# Patient Record
Sex: Female | Born: 1955 | Race: White | Hispanic: No | Smoking: Never smoker
Health system: Southern US, Community
[De-identification: ages and names within clinical notes are randomized; demographics above are authoritative.]

## PROBLEM LIST (undated history)

## (undated) DIAGNOSIS — F329 Major depressive disorder, single episode, unspecified: Secondary | ICD-10-CM

## (undated) DIAGNOSIS — F32A Depression, unspecified: Secondary | ICD-10-CM

## (undated) DIAGNOSIS — I10 Essential (primary) hypertension: Secondary | ICD-10-CM

## (undated) HISTORY — PX: ABDOMINAL HYSTERECTOMY: SHX81

## (undated) HISTORY — PX: CERVICAL SPINE SURGERY: SHX589

---

## 1998-08-31 ENCOUNTER — Other Ambulatory Visit: Admission: RE | Admit: 1998-08-31 | Discharge: 1998-08-31 | Payer: Self-pay | Admitting: *Deleted

## 1999-10-19 ENCOUNTER — Other Ambulatory Visit: Admission: RE | Admit: 1999-10-19 | Discharge: 1999-10-19 | Payer: Self-pay | Admitting: *Deleted

## 2000-11-28 ENCOUNTER — Other Ambulatory Visit: Admission: RE | Admit: 2000-11-28 | Discharge: 2000-11-28 | Payer: Self-pay | Admitting: *Deleted

## 2001-04-15 ENCOUNTER — Encounter (INDEPENDENT_AMBULATORY_CARE_PROVIDER_SITE_OTHER): Payer: Self-pay

## 2001-04-15 ENCOUNTER — Ambulatory Visit (HOSPITAL_COMMUNITY): Admission: RE | Admit: 2001-04-15 | Discharge: 2001-04-15 | Payer: Self-pay | Admitting: *Deleted

## 2001-12-16 ENCOUNTER — Other Ambulatory Visit: Admission: RE | Admit: 2001-12-16 | Discharge: 2001-12-16 | Payer: Self-pay | Admitting: *Deleted

## 2002-03-27 ENCOUNTER — Ambulatory Visit (HOSPITAL_COMMUNITY): Admission: RE | Admit: 2002-03-27 | Discharge: 2002-03-27 | Payer: Self-pay | Admitting: *Deleted

## 2002-12-22 ENCOUNTER — Other Ambulatory Visit: Admission: RE | Admit: 2002-12-22 | Discharge: 2002-12-22 | Payer: Self-pay | Admitting: *Deleted

## 2003-04-01 ENCOUNTER — Ambulatory Visit (HOSPITAL_COMMUNITY): Admission: RE | Admit: 2003-04-01 | Discharge: 2003-04-01 | Payer: Self-pay | Admitting: *Deleted

## 2003-07-02 ENCOUNTER — Emergency Department (HOSPITAL_COMMUNITY): Admission: EM | Admit: 2003-07-02 | Discharge: 2003-07-02 | Payer: Self-pay | Admitting: Emergency Medicine

## 2003-12-28 ENCOUNTER — Other Ambulatory Visit: Admission: RE | Admit: 2003-12-28 | Discharge: 2003-12-28 | Payer: Self-pay | Admitting: *Deleted

## 2004-02-01 ENCOUNTER — Other Ambulatory Visit: Admission: RE | Admit: 2004-02-01 | Discharge: 2004-02-01 | Payer: Self-pay | Admitting: *Deleted

## 2004-03-24 ENCOUNTER — Encounter (INDEPENDENT_AMBULATORY_CARE_PROVIDER_SITE_OTHER): Payer: Self-pay | Admitting: Specialist

## 2004-03-24 ENCOUNTER — Inpatient Hospital Stay (HOSPITAL_COMMUNITY): Admission: EM | Admit: 2004-03-24 | Discharge: 2004-03-27 | Payer: Self-pay | Admitting: *Deleted

## 2004-04-20 ENCOUNTER — Ambulatory Visit (HOSPITAL_COMMUNITY): Admission: RE | Admit: 2004-04-20 | Discharge: 2004-04-20 | Payer: Self-pay | Admitting: *Deleted

## 2004-04-26 ENCOUNTER — Encounter: Admission: RE | Admit: 2004-04-26 | Discharge: 2004-04-26 | Payer: Self-pay | Admitting: *Deleted

## 2005-04-25 ENCOUNTER — Ambulatory Visit (HOSPITAL_COMMUNITY): Admission: RE | Admit: 2005-04-25 | Discharge: 2005-04-25 | Payer: Self-pay | Admitting: *Deleted

## 2006-05-01 ENCOUNTER — Ambulatory Visit (HOSPITAL_COMMUNITY): Admission: RE | Admit: 2006-05-01 | Discharge: 2006-05-01 | Payer: Self-pay | Admitting: *Deleted

## 2007-06-10 ENCOUNTER — Ambulatory Visit (HOSPITAL_COMMUNITY): Admission: RE | Admit: 2007-06-10 | Discharge: 2007-06-10 | Payer: Self-pay | Admitting: *Deleted

## 2007-06-13 ENCOUNTER — Encounter: Admission: RE | Admit: 2007-06-13 | Discharge: 2007-06-13 | Payer: Self-pay | Admitting: Obstetrics and Gynecology

## 2008-05-26 ENCOUNTER — Ambulatory Visit (HOSPITAL_COMMUNITY): Admission: RE | Admit: 2008-05-26 | Discharge: 2008-05-26 | Payer: Self-pay | Admitting: Obstetrics and Gynecology

## 2008-12-13 ENCOUNTER — Emergency Department (HOSPITAL_COMMUNITY): Admission: EM | Admit: 2008-12-13 | Discharge: 2008-12-14 | Payer: Self-pay | Admitting: Emergency Medicine

## 2011-05-04 NOTE — Discharge Summary (Signed)
NAMEVENISHA, Meredith Mccormick                      ACCOUNT NO.:  [id number]   MEDICAL RECORD NO.:  [id number]                   PATIENT TYPE:  INP   LOCATION:  0473                                 FACILITY:  [facility]   PHYSICIAN:  Pershing Cox, M.D.            DATE OF BIRTH:  1956-02-06   DATE OF ADMISSION:  03/24/2004  DATE OF DISCHARGE:  03/27/2004                                 DISCHARGE SUMMARY   ADMISSION DIAGNOSIS:  Abnormal glandular cells on Pap smear with  unsatisfactory colposcopy.   HISTORY OF PRESENT ILLNESS:  For details of the patient's admission history  and physical, please see the transcribed note dated on March 24, 2004.  Briefly, this 55 year old female had a recent Pap smear showing atypical  glandular cells.  She had a previous D&C hysteroscopy and tissue from this  procedure two years ago was reviewed and found no evidence of the  abnormality there.  Dr. Berneta Levins was concerned enough about this Pap  smear that she recommended excision.  Colposcopic examination showed no  lesion, and for that reason the patient was brought to the operating room  for a cold knife cone biopsy.   HOSPITAL COURSE:  On the day of admission, the patient was taken to the  operating room where a cold knife cone biopsy was performed.  Because of  unrelenting hemorrhage, a decision was made to explore her to do a total  abdominal hysterectomy.  This procedure showed a defect lateral to the right  uterosacral ligament which was the source of the bleeding.  By moving the  ureter aside we were able to safely do a total abdominal hysterectomy.  The  majority of the blood loss was lost during the cone biopsy, but the  estimated blood loss was 1400 cc.  Postoperatively, the patient's hemoglobin  dropped to 8.4.  It stabilized and she had a very rapid postoperative  recovery.  She was able to be discharged on the morning of postoperative day  #3.  At that time, her hemoglobin was 7.8, but she  was taking a regular diet  and able to begin iron therapy which she will be doing b.i.d.  Pathology  showed cervical dysplasia extending into glands.  This was not available at  the time of her discharge, but is present on the chart at the time of this  dictation.  There was focal adenomyosis, otherwise, the pathology was  benign.  The patient will be seen in my office for staple removal in four  days.   DISCHARGE DIAGNOSES:  1. Moderate dysplasia of the cervix.  2. Intraoperative hemorrhage.  3. Anemia.                                               Pershing Cox, M.D.  MAJ/MEDQ  D:  04/03/2004  T:  04/03/2004  Job:  [id number]

## 2011-05-04 NOTE — Op Note (Signed)
[facility]  Patient:    Meredith Mccormick, Meredith Mccormick                   MRN: [id number] Proc. Date: 04/15/01 Adm. Date:  [id number] Attending:  Marin Comment                           Operative Report  PREOPERATIVE DIAGNOSIS:  Menorrhagia, abnormal endometrium on sonogram.  POSTOPERATIVE DIAGNOSIS:  Menorrhagia, normal endometrial cavity, scant endometrial curettings.  PROCEDURE:  Exam under anesthesia, hysteroscopy, dilation and curettage.  SURGEON:  Pershing Cox, M.D.  ANESTHESIA:  MAC plus Marcaine paracervical block.  INDICATIONS FOR PROCEDURE:  Meredith Mccormick is a 55 year old female, who has been followed in my office for routine gynecologic care for several years. Over the last year, she has experienced heavier bleeding with pad changes every two hours to every 30 minutes on a heavy day.  She also passes large clots.  At the time of her last visit, she complained of increase in her bleeding.  She was brought to the office for a sonogram which showed a 1.4 cm endometrium, irregular in contour.  Hydrosonogram was attempted, but the patient did not tolerate the injection of fluid.  With the little visualization we got, we thought there might be a polyp on the posterior and anterior wall.  After discussing the procedure with the patient, she was brought to the operating room today for diagnostic testing.  FINDINGS:  The uterus is approximately 8 weeks in size.  There are no adnexal masses.  The cavity sounds to 7 cm.  There were no endometrial filling defects.  The ostia were occluded, but the fundus of the uterus was adequately visualized.  DESCRIPTION OF PROCEDURE:  [name] was brought to the operating room with an IV in place.  She received 1 gram of Ancef in the holding area. Supine on the OR table, IV sedation was administered.  She was then placed into Allen stirrups.  Hibiclens was used to prep the perineum and vagina.   She was draped sterilely for a vaginal procedure with a collecting drape for effluent management.  Bivalve speculum was inserted into the vagina.  The cervix was visualized and grasped with a single-tooth tenaculum on the anterior cervix after 0.25% Marcaine was injected into the anterior cervix. Marcaine paracervical block was administered at 3, 4, 7, and 8 positions using a total volume of 18 cc of this solution.  Kevorkian curette was used to obtain endocervical curettings.  The uterine sound then passed to a depth of 7 cm.  The cervix was dilated with 0 Pratt dilators to a size 35.  Resectoscope was introduced into the cervix, and through-and-through irrigation was used to help visualize the upper endometrium.  Pictures were taken to document what we could see.  There was a modest amount of shaggy endometrium.  After the visualization showed no evidence of polyps or submucosal myomas, a decision was made to perform D&C and then look again.  The resectoscope was removed. Small sharp curette was used to serially dilate four quadrants and then selectively in all 12 dimensions of the cervix.  Following this, the uterus felt scrapie in texture.  Neigs curette was used to curette the lining.  This was definitely scrapie in texture throughout the endometrial lining.  No polyps were obtained.  No abnormal endometrium was obtained.  The resectoscope was reintroduced.  The cavity was smooth.  Further  pictures were taken to document this.  The resectoscope was withdrawn.  The patient was cleansed with water to wash the Hibiclens off of her perineum.  She was placed again in the supine position and taken to the recovery room for recovery. DD:  04/15/01 TD:  04/15/01 Job: [id number]

## 2011-05-04 NOTE — Op Note (Signed)
NAMEBERKLEIGH, [name]                      ACCOUNT NO.:  [id number]   MEDICAL RECORD NO.:  [id number]                   PATIENT TYPE:  INP   LOCATION:  0473                                 FACILITY:  [facility]   PHYSICIAN:  Pershing Cox, M.D.            DATE OF BIRTH:  Sep 14, 1956   DATE OF PROCEDURE:  03/24/2004  DATE OF DISCHARGE:                                 OPERATIVE REPORT   PREOPERATIVE DIAGNOSES:  Abnormal glandular cells on Pap smear.   POSTOPERATIVE DIAGNOSES:  Posterior cervical perforation during cone biopsy,  atypical glandular cells on Pap smear.   PROCEDURE:  Cold knife conization of the cervix, exploratory laparotomy,  total abdominal hysterectomy with right ureteral exploration.   SURGEON:  Pershing Cox, M.D.   ASSISTANT:  Maxie Better, M.D. and Genia Del, M.D.   ANESTHESIA:  General endotracheal.   INDICATIONS FOR PROCEDURE:  [name] is a 55 year old female who was well  known to my office.  She has been followed in my office for some time.  In  2002, she had a D&C hysteroscopy for menorrhagia and an abnormal sonogram.  At that time, there were no endometrial filling defects found and the  pathology specimen showed a benign endocervical mucosa and benign  proliferative endometrium with hyperplasia.  The patient had a Pap smear in  my office on February 01, 2004 which showed glandular cell abnormality with  a atypical glandular cells seen. These were worrisome for glandular  involvement with dysplasia or neoplasia and this Pap as well as the  patient's previous biopsy were reviewed by Dr. Clelia Croft. She was concerned that  this might represent adenocarcinoma of the cervix and therefore [name]  was brought to the operating room and the cervix was examined by colposcopy.  There were no abnormal findings. Endometrial biopsy was obtained and this  also showed benign tissue, degenerative secretory type. After counseling,  she agreed to a  cone biopsy. Our plan at that time was if the cone biopsy  was negative we would proceed to hysterectomy.  Today during her cone  biopsy, there was uncontrolled bleeding. On exploration, she had a posterior  cervical perforation just to the lateral margin of the right uterosacral  ligament.   DESCRIPTION OF PROCEDURE:  [name] was brought to the operating  room with an IV in place. PAS stockings were placed on her lower extremities  and she received a gram of Ancef in the holding area.  Supine on the OR  table, IV sedation was administered and the patient was then administered  general anesthesia by an LMA mask. Later in the operation when hysterectomy  was necessary, the LMA was converted to an endotracheal tube.  Supine on the  OR table, the perineum and vagina were prepped with a solution of Hibiclens  and sterile linens were applied. She was then draped for a sterile vaginal  procedure. A bivalve speculum was inserted into the  vagina. The cervix was  visualized and 0.25% Marcaine was injected into the anterior cervix.  Angled  sutures were then placed on the lateral margins of the cervix placing a  superficial and then deeper __________ to ligate the descending cervical  branches of the uterine artery.  Vasopressin was then injected into the  cervix. This was a dilute solution of vasopressin with 10 units and 60 mL.  I used a total volume of 20 mL of this solution.  The patient's blood  pressure was stable during the injection.  The uterine sound then passed to  a depth of the cone which was about 4 cm.  Using an 11 blade, a 2 1/2 cm  exocervical cone was cut.  By using retraction, the cone was brought deeper  into the substance of the cervix.  At this point, an Allis clamp was placed  to oppose the anterior and posterior portions of the cone.  The cone was  then further incised with the blade.  Once the cone had come to a 1 cm tip,  I used Mayo scissors to excise the cone.   There was a moderate amount of  bleeding during this procedure and therefore I placed a second angled suture  on each side, this did not control the bleeding. I then placed Sturmdorf  sutures anterior and posterior and the bleeding was still not controlled.  At this point, I put some packing into the upper cervix hoping to be able to  control the bleeding but the patient bled through the packing.  I placed a  digit into the cervical canal and felt a defect onto the patient's right.  At this point, I made a decision to proceed with a hysterectomy.  I was able  to control the patient's bleeding from the uterus by applying firm pressure  to the uterus and lower uterine segment through the anterior abdominal wall  essentially compressing the uterus against the symphysis. There was no blood  loss from this point on until we began the hysterectomy.   The anterior abdominal wall was shaved just over the symphysis.  Betadine  was used to prep the anterior abdominal wall and with my hand in the vagina  the patient was draped for an abdominal hysterectomy.  A Foley catheter was  inserted into the urethra again while I was holding pressure on the uterus.  At this point, the surgical tech took my place and continued pressure along  the anterior abdominal wall.  Prior to leaving the vagina, I cut the sutures  which had been tagged holding the lateral margins of the cervix.  These were  left long enough for [name] to identify intraabdominally.   An incision was made in the skin approximately three finger breadths above  the symphysis pubis, carried down to the subcutaneous tissues until the  fascia was exposed. The fascia was opened initially with cautery and then  with scissors extending the incision laterally. I would like to mention here  that the fascia was very thin consistent with fascia you would notice on a  more elderly patient.  The fascia was separated from the rectus muscles and the rectus  muscles were separated in the midline exposing the peritoneum.  The peritoneum was tented and opened atraumatically. The peritoneal incision  was extended. Once in the peritoneal cavity, it was clear that there was  bleeding in the peritoneal cavity consistent with damage to one of the major  uterine vessels. For this reason,  I put my hand into the pelvis and grasped  the uterus and pulled it tight under tension again placing pressure on it at  this point.  The Balfour retractor was then placed into the anterior  abdominal wall,  retracting the side wall as well I put pressure on the  uterus.  The bowel was gathered up into the upper abdomen with moist laps.  Long Kelly clamps were placed along the sides of the uterus. The bladder  blade was used to retract the bladder. The round ligaments were suture  ligated and transected bilaterally.  They were held with a hemostat for  later in the case. The broad ligament was perforated beneath the fallopian  tube and the fallopian tube and ovary were separated from the uterus.  This  pedicle was then clamped and suture ligated. Later in the case, this would  be revised once I decided to leave the ovaries in place. Later in the case,  the pedicle was trimmed, clamped, suture and free tie ligated and then fixed  to the round ligaments.  Both ovaries and fallopian tubes had simple cysts  on them and there was some bleeding from a corpus luteum on the patient's  left but this was controlled during the procedure.  It required no suturing  and no dissection.   The bladder was separated from the lower uterine segment and pushed down off  of the upper cervix.  The uterine arteries were skeletonized first on the  left and then on the right.  These uterine artery pedicles were cut and  suture ligated with #0 Vicryl suture. On the patient's right, I was able to  ligate the uterine artery in a manner just identical to the left but I could  not go down the  cardinal ligament because there was a hole that was  perforating from the side of the cervix lateral to the uterosacral ligament.   At this point, the bleeding was well controlled and therefore I exposed the  ureter on the right and dissected it all of its length down to the area  where it turned to enter the bladder. We had this ureter exposed throughout  all of the sutures that we placed on the right side in order to excise the  cervix.  Straight clamps were placed along the patient's left cardinal  ligament and these were suture ligated using Heaney technique.  The  uterosacral ligament was also clamped and suture ligated.  We were able to  enter the vagina on the patient's left. Then by using traction on the  residual cervix with the previous stitches as our mark, we were able to  excise the residual cervix from the upper vagina holding the vagina with  clamps to limit blood loss.  Once we had approached the right angle, the cervix was still attached to the site of the right uterosacral ligament.  The ureter was moved laterally and a curved clamp was placed across the  uterosacral ligament and the vagina. This was suture ligated using a Heaney  technique and the specimen was completely excised.  Angled sutures were  placed on both sides of the vagina.  The vagina was run with a locking #0  Vicryl suture. The vaginal mucosa was closed side to side using interrupted  figure-of-eight sutures.  The bladder was protected throughout this  dissection.   At this point, both uteroovarian pedicles were freshened and secured to the  round ligament to lift them out of  the pelvis. The patient's cul-de-sac was  deep but because of her significant blood loss up to this point, I did not  want to do a cul-de-sac plication.   Because of the cervical perforation, the bowel was run from the ileocecal  valve to the ligament of Treitz. There was no evidence of bowel injury.  The  cecum was inspected and  was totally healthy as were the ascending and  descending colon. The transverse colon was only palpated high in the abdomen  and it was full of stool.   The peritoneal cavity was irrigated with warm saline and the laps were  removed.  The small bowel was again run from its length and then layered  into the pelvis.  The rectus muscles were inspected and were appropriate,  cautery was used for hemostasis. The fascial edges were brought together  with running #0 Monocryl suture starting from the lateral edges and tying  two individual sutures in the middle.  The subcutaneous tissues were  irrigated and then closed with skin staples.   Estimated blood loss 1400 mL.  Urine output 400 mL.  Fluids 4000 mL.   COMPLICATIONS:  Posterior cervical perforation during a scheduled cone  biopsy.                                               Pershing Cox, M.D.    MAJ/MEDQ  D:  03/24/2004  T:  03/24/2004  Job:  [id number]   cc:   Genia Del, M.D.  [address]  [place]  [place] [address]  Fax: [phone]   Maxie Better, M.D.  [address]  [place]  [place] [address]  Fax: [phone]

## 2011-05-04 NOTE — H&P (Signed)
Meredith, Mccormick                      ACCOUNT NO.:  [id number]   MEDICAL RECORD NO.:  [id number]                   PATIENT TYPE:  INP   LOCATION:  0473                                 [facility]ACILITY:  [facility]   PHYSICIAN:  Pershing Cox, M.D.            DATE OF BIRTH:  [birth year]   DATE OF ADMISSION:  03/24/2004  DATE OF DISCHARGE:                                HISTORY & PHYSICAL   ADMISSION DIAGNOSES:  Total abdominal hysterectomy after posterior cervical  perforation from cervical cone biopsy.   HISTORY OF PRESENT ILLNESS:  Meredith Mccormick is 55 years old.  She is a  gravida 2, para 2 female.  She has been followed some time for heavy vaginal  bleeding.  In 2002, she underwent hysteroscopy D&C for heavy vaginal  bleeding and an abnormal sonogram.  That procedure showed no evidence of a  polyp and the pathology obtained at this time was consistent with both  benign intracervical tissue and benign endometrium.  At the time of her  annual examination in January of this year, a Pap smear was performed.  The  patient had complained at that time of heavy vaginal bleeding lasting 10  days with her last 2 cycles.  Her Pap smear returned consistent with  atypical glandular cells.  The previous pathology was reviewed by Dr. Berneta Levins and she was concerned that this patient might have an adenocarcinoma of  the cervix.  I performed a colposcopic examination on her and saw nothing  suspicious.  Endometrial biopsy was performed at this time and there was  only normal endometrium of secretory type found on this testing.  The  patient was counseled regarding the abnormal Pap smear and the decision was  made to pursue it with a cervical conization.  Our plan at the time of her  cone biopsy was that if nothing was found on her cone biopsy, it would be  necessary to perform a hysterectomy.   The patient was taken to the operating room this afternoon.  Cone biopsy was  performed using a  cold knife technique.  Hemorrhage after the cone biopsy  resulted in multiple attempts to control the bleeding.  I was not able to  control the bleeding even by packing and the decision was made to move to  hysterectomy.  At the time of hysterectomy, there was found to be a  perforation just lateral to the right uterosacral ligament involving a  uterine vein or artery which was probably the site of our abnormal bleeding  which could not be controlled.  The patient is admitted for postop care  after this event.   PAST MEDICAL HISTORY:   ALLERGIES:  SULFA and PENICILLIN.   MEDICATIONS:  1. Lexapro 20 mg p.o. daily.  2. Trazodone to sleep.   SERIOUS MEDICAL ILLNESSES:  1. History of episode of toxic shock syndrome.  2. Questionable history of pelvic inflammatory disease and depression.  PAST SURGICAL HISTORY:  1. Hysteroscopy D&C April of 2002.  2. History of rhinoplasty.  3. History of breast biopsy in 1998.   SOCIAL HISTORY:  The patient is married.  She has worked in the past as  Probation officer.   Her mother died of a GI bleed.  She had chronic lymphocytic leukemia and a  history of breast cancer x2 and a history of glaucoma.  The patient's father  died of lung cancer.  She has one sister who is alive and well and a family  history of osteoporosis.   [employer] review of systems, which is a 13-system review, showed all negative  systems at the time of her last exam.   PHYSICAL EXAMINATION:  GENERAL:  A well-developed, well-nourished Caucasian  female in no acute distress.  VITAL SIGNS:  BP 120/84, pulse 66, weight 164, height 5 feet 6-1/4 inches.  BMI 27.  HEENT:  Normocephalic, anicteric. EOMI, PERRLA.  NECK:  Thyroid is normal to palpation.  No bruits.  LUNGS:  Clear to auscultation and percussion.  CARDIOVASCULAR:  Regular rate and rhythm without murmur.  ABDOMEN AND BACK:  Show 4+ seborrheic keratoses.  There is no evidence of  hernia, no guarding, no rebound.  No CVA or spine tenderness.  LYMPHATICS:  No supraclavicular, axillary, or inguinal adenopathy.  SKIN:  No noted skin lesions.  NEUROPSYCHIATRIC:  Oriented to person, place, and time.  Mood and affect are  appropriate.  BREASTS:  Shows no palpable abnormalities.  PELVIC:  Normal external genitalia.  There is a first-degree rectocele.  The  vagina has normal appearance.  There is a wide ectropion of the cervix.  The  uterus is small, anteflexed, mobile, and nontender.  No adnexal masses.  GENITOURINARY:  The urethra and urethral meatus are well supported.  Bladder  is well supported.  RECTAL:  Anus and perineum intact.   ASSESSMENT:  History of atypical glandular cells on most recent Pap smear  first with cold knife conization with perforation and emergency total  abdominal hysterectomy.                                               Pershing Cox, M.D.    MAJ/MEDQ  D:  03/24/2004  T:  03/25/2004  Job:  [id number]

## 2011-07-01 ENCOUNTER — Emergency Department (INDEPENDENT_AMBULATORY_CARE_PROVIDER_SITE_OTHER): Payer: 59

## 2011-07-01 ENCOUNTER — Emergency Department (HOSPITAL_BASED_OUTPATIENT_CLINIC_OR_DEPARTMENT_OTHER)
Admission: EM | Admit: 2011-07-01 | Discharge: 2011-07-01 | Disposition: A | Discharge status: Home / Self Care | Payer: 59 | Attending: Emergency Medicine | Admitting: Emergency Medicine

## 2011-07-01 ENCOUNTER — Encounter: Payer: Self-pay | Admitting: *Deleted

## 2011-07-01 DIAGNOSIS — M25439 Effusion, unspecified wrist: Secondary | ICD-10-CM | POA: Insufficient documentation

## 2011-07-01 DIAGNOSIS — R609 Edema, unspecified: Secondary | ICD-10-CM

## 2011-07-01 DIAGNOSIS — M25539 Pain in unspecified wrist: Secondary | ICD-10-CM | POA: Insufficient documentation

## 2011-07-01 DIAGNOSIS — F3289 Other specified depressive episodes: Secondary | ICD-10-CM | POA: Insufficient documentation

## 2011-07-01 DIAGNOSIS — R110 Nausea: Secondary | ICD-10-CM | POA: Insufficient documentation

## 2011-07-01 DIAGNOSIS — W540XXA Bitten by dog, initial encounter: Secondary | ICD-10-CM

## 2011-07-01 DIAGNOSIS — M25549 Pain in joints of unspecified hand: Secondary | ICD-10-CM

## 2011-07-01 DIAGNOSIS — I10 Essential (primary) hypertension: Secondary | ICD-10-CM | POA: Insufficient documentation

## 2011-07-01 DIAGNOSIS — L03119 Cellulitis of unspecified part of limb: Secondary | ICD-10-CM

## 2011-07-01 DIAGNOSIS — F329 Major depressive disorder, single episode, unspecified: Secondary | ICD-10-CM | POA: Insufficient documentation

## 2011-07-01 DIAGNOSIS — L02519 Cutaneous abscess of unspecified hand: Secondary | ICD-10-CM | POA: Insufficient documentation

## 2011-07-01 HISTORY — DX: Essential (primary) hypertension: I10

## 2011-07-01 HISTORY — DX: Depression, unspecified: F32.A

## 2011-07-01 HISTORY — DX: Major depressive disorder, single episode, unspecified: F32.9

## 2011-07-01 LAB — CBC
MCHC: 34.8 g/dL (ref 30.0–36.0)
MCV: 92.9 fL (ref 78.0–100.0)
Platelets: 225 10*3/uL (ref 150–400)
RDW: 12.8 % (ref 11.5–15.5)

## 2011-07-01 LAB — DIFFERENTIAL
Basophils Absolute: 0.1 10*3/uL (ref 0.0–0.1)
Lymphs Abs: 1.4 10*3/uL (ref 0.7–4.0)
Monocytes Absolute: 1.1 10*3/uL — ABNORMAL HIGH (ref 0.1–1.0)
Monocytes Relative: 6 % (ref 3–12)
Neutro Abs: 14.9 10*3/uL — ABNORMAL HIGH (ref 1.7–7.7)
Neutrophils Relative %: 85 % — ABNORMAL HIGH (ref 43–77)

## 2011-07-01 LAB — BASIC METABOLIC PANEL
Chloride: 99 mEq/L (ref 96–112)
Creatinine, Ser: 0.7 mg/dL (ref 0.50–1.10)
Glucose, Bld: 140 mg/dL — ABNORMAL HIGH (ref 70–99)
Potassium: 3.2 mEq/L — ABNORMAL LOW (ref 3.5–5.1)

## 2011-07-01 MED ORDER — CLINDAMYCIN PHOSPHATE 600 MG/50ML IV SOLN
INTRAVENOUS | Status: AC
  Administered 2011-07-01: 600 mg via INTRAVENOUS
  Filled 2011-07-01: qty 50

## 2011-07-01 MED ORDER — CLINDAMYCIN HCL 150 MG PO CAPS: 300.0000 mg | ORAL_CAPSULE | Freq: Three times a day (TID) | ORAL | Status: AC

## 2011-07-01 MED ORDER — CIPROFLOXACIN IN D5W 400 MG/200ML IV SOLN
400.0000 mg | Freq: Two times a day (BID) | INTRAVENOUS | Status: DC
  Administered 2011-07-01: 400 mg via INTRAVENOUS
  Filled 2011-07-01 (×2): qty 200

## 2011-07-01 MED ORDER — TETANUS-DIPHTH-ACELL PERTUSSIS 5-2-15.5 LF-MCG/0.5 IM SUSP
0.5000 mL | Freq: Once | INTRAMUSCULAR | Status: AC
  Administered 2011-07-01: 0.5 mL via INTRAMUSCULAR
  Filled 2011-07-01: qty 0.5

## 2011-07-01 MED ORDER — KETOROLAC TROMETHAMINE 30 MG/ML IJ SOLN
INTRAMUSCULAR | Status: AC
  Filled 2011-07-01: qty 1

## 2011-07-01 MED ORDER — ONDANSETRON HCL 4 MG/2ML IJ SOLN
INTRAMUSCULAR | Status: AC
  Filled 2011-07-01: qty 2

## 2011-07-01 MED ORDER — MORPHINE SULFATE 4 MG/ML IJ SOLN: 4.0000 mg | INTRAMUSCULAR | Status: DC | PRN

## 2011-07-01 MED ORDER — TETANUS-DIPHTH-ACELL PERTUSSIS 5-2-15.5 LF-MCG/0.5 IM SUSP
INTRAMUSCULAR | Status: AC
  Filled 2011-07-01: qty 0.5

## 2011-07-01 MED ORDER — CIPROFLOXACIN HCL 500 MG PO TABS: 500.0000 mg | ORAL_TABLET | Freq: Two times a day (BID) | ORAL | Status: AC

## 2011-07-01 MED ORDER — KETOROLAC TROMETHAMINE 30 MG/ML IJ SOLN
30.0000 mg | Freq: Once | INTRAMUSCULAR | Status: AC
  Administered 2011-07-01: 30 mg via INTRAVENOUS

## 2011-07-01 MED ORDER — CLINDAMYCIN PHOSPHATE 600 MG/50ML IV SOLN
600.0000 mg | Freq: Three times a day (TID) | INTRAVENOUS | Status: DC
  Administered 2011-07-01 (×2): 600 mg via INTRAVENOUS
  Filled 2011-07-01: qty 50

## 2011-07-01 MED ORDER — HYDROCODONE-ACETAMINOPHEN 5-500 MG PO TABS: 1.0000 | ORAL_TABLET | Freq: Four times a day (QID) | ORAL | Status: AC | PRN

## 2011-07-01 MED ORDER — ONDANSETRON HCL 4 MG/2ML IJ SOLN
4.0000 mg | Freq: Once | INTRAMUSCULAR | Status: AC
  Administered 2011-07-01: 4 mg via INTRAVENOUS

## 2011-07-01 MED ORDER — MORPHINE SULFATE 2 MG/ML IJ SOLN
INTRAMUSCULAR | Status: AC
  Administered 2011-07-01: 2 mg via INTRAVENOUS
  Filled 2011-07-01: qty 1

## 2011-07-01 MED ORDER — ONDANSETRON HCL 4 MG/2ML IJ SOLN: 4.0000 mg | Freq: Four times a day (QID) | INTRAMUSCULAR | Status: DC | PRN

## 2011-07-01 MED ORDER — TETANUS-DIPHTHERIA TOXOIDS TD 5-2 LFU IM INJ: 0.5000 mL | INJECTION | Freq: Once | INTRAMUSCULAR | Status: DC

## 2011-07-01 NOTE — ED Notes (Signed)
Pt was playing with her dog on Thursday night and was bitten on the left hand pt with nausea fever and redness to left hand. The animals shots are UTD

## 2011-07-01 NOTE — ED Notes (Signed)
Pt. Aware of IV med to be given at 1300 then she may be discharged.

## 2011-07-01 NOTE — ED Notes (Signed)
Pt resting quietly. Eating breakfast. No complaints.

## 2011-07-01 NOTE — ED Provider Notes (Signed)
History     Chief Complaint  Patient presents with  . Wound Infection   Patient is a 55 y.o. female presenting with animal bite. The history is provided by the patient.  Animal Bite   Pt reports she was bitten by her own dog about 3 days ago on the L wrist. She has had increasing pain, swelling and redness since then particularly bad tonight. She had some chills, but no documented fever. No drainage from the wound. She has felt nauseated, but no vomiting. Dog has had rabies. Pt needs tetanus. Pain is moderate to severe and worse with movement.   Past Medical History  Diagnosis Date  . Hypertension   . Depression     Past Surgical History  Procedure Date  . Abdominal hysterectomy     History reviewed. No pertinent family history.  History  Substance Use Topics  . Smoking status: Never Smoker   . Smokeless tobacco: Not on file  . Alcohol Use: No     occasional    OB History    Grav Para Term Preterm Abortions TAB SAB Ect Mult Living                  Review of Systems  All other systems reviewed and are negative.    Physical Exam  BP 152/71  Pulse 94  Temp(Src) 99.7 F (37.6 C) (Oral)  Resp 20  SpO2 96%  Physical Exam  Nursing note and vitals reviewed. Constitutional: She appears well-developed and well-nourished.  HENT:  Head: Normocephalic and atraumatic.  Eyes: Pupils are equal, round, and reactive to light.  Neck: Normal range of motion.  Cardiovascular: Normal rate.   Pulmonary/Chest: Effort normal and breath sounds normal.  Abdominal: Soft. There is no tenderness.  Musculoskeletal: She exhibits edema and tenderness.       Moderate swelling and erythema to L hand with healing dog bite on L wrist, no focal fluctuance, no proximal streaking    ED Course  Procedures  MDM PT has allergy to PCN, so Clinda/Cipro given IV. Plan for Obs protocol, however EPIC system will not let me place her in observation in computer system. She was started on  Observation at 0400hrs.      Kerrigan Gombos B. Bernette Mayers, MD 07/02/11 0737

## 2011-07-01 NOTE — ED Notes (Signed)
Patient reexamined after change of shift. Care of patient assumed from Dr. Bernette Mayers.  Patient had dog bite to her left ulnar surface of her wrist with progressive swelling over the last 24 hours. Currently she has a swollen hand from her wrist through the proximal interphalangeal joints of all fingers. There is erythema and significant tenderness on exam. I have again offered patient admission to the hospital for ongoing IV antibiotics and hand surgery consult. She again declines suggesting that she will follow up as an outpatient if symptoms worsen. She is amenable to a second dose of IV antibiotics which will be given in the next 4 hours. She is aware that if symptoms worsen she should return immediately to the emergency department for reevaluation stabilizing treatment including surgical consult for definitive care of he wounds.  Vida Roller, MD 07/01/11 [phone]

## 2013-09-08 ENCOUNTER — Ambulatory Visit: Payer: 59 | Attending: Orthopedic Surgery | Admitting: Physical Therapy

## 2013-09-08 DIAGNOSIS — IMO0001 Reserved for inherently not codable concepts without codable children: Secondary | ICD-10-CM | POA: Insufficient documentation

## 2013-09-08 DIAGNOSIS — M542 Cervicalgia: Secondary | ICD-10-CM | POA: Insufficient documentation

## 2013-09-08 DIAGNOSIS — M6281 Muscle weakness (generalized): Secondary | ICD-10-CM | POA: Insufficient documentation

## 2013-09-08 DIAGNOSIS — M25519 Pain in unspecified shoulder: Secondary | ICD-10-CM | POA: Insufficient documentation

## 2013-09-08 DIAGNOSIS — R293 Abnormal posture: Secondary | ICD-10-CM | POA: Insufficient documentation

## 2013-09-09 ENCOUNTER — Ambulatory Visit: Payer: 59 | Admitting: Physical Therapy

## 2013-09-15 ENCOUNTER — Ambulatory Visit: Payer: 59 | Admitting: Rehabilitation

## 2013-09-17 ENCOUNTER — Ambulatory Visit: Payer: 59 | Attending: Orthopedic Surgery | Admitting: Physical Therapy

## 2013-09-17 DIAGNOSIS — IMO0001 Reserved for inherently not codable concepts without codable children: Secondary | ICD-10-CM | POA: Insufficient documentation

## 2013-09-17 DIAGNOSIS — M6281 Muscle weakness (generalized): Secondary | ICD-10-CM | POA: Insufficient documentation

## 2013-09-17 DIAGNOSIS — M542 Cervicalgia: Secondary | ICD-10-CM | POA: Insufficient documentation

## 2013-09-17 DIAGNOSIS — M25519 Pain in unspecified shoulder: Secondary | ICD-10-CM | POA: Insufficient documentation

## 2013-09-17 DIAGNOSIS — R293 Abnormal posture: Secondary | ICD-10-CM | POA: Insufficient documentation

## 2013-09-22 ENCOUNTER — Ambulatory Visit: Payer: 59 | Admitting: Physical Therapy

## 2013-09-24 ENCOUNTER — Ambulatory Visit: Payer: 59 | Admitting: Physical Therapy

## 2013-09-29 ENCOUNTER — Ambulatory Visit: Payer: 59 | Admitting: Physical Therapy

## 2013-10-01 ENCOUNTER — Ambulatory Visit: Payer: 59 | Admitting: Rehabilitation

## 2013-10-06 ENCOUNTER — Ambulatory Visit: Payer: 59 | Admitting: Physical Therapy

## 2013-10-08 ENCOUNTER — Ambulatory Visit: Payer: 59 | Admitting: Physical Therapy

## 2013-10-12 ENCOUNTER — Ambulatory Visit: Payer: 59 | Admitting: Physical Therapy

## 2013-10-15 ENCOUNTER — Ambulatory Visit: Payer: 59 | Admitting: Rehabilitation

## 2013-10-19 ENCOUNTER — Encounter: Payer: 59 | Admitting: Rehabilitation

## 2013-10-21 ENCOUNTER — Encounter: Payer: 59 | Admitting: Rehabilitation

## 2013-12-01 ENCOUNTER — Other Ambulatory Visit: Payer: Self-pay | Admitting: Specialist

## 2013-12-01 DIAGNOSIS — M5010 Cervical disc disorder with radiculopathy, unspecified cervical region: Secondary | ICD-10-CM

## 2013-12-09 ENCOUNTER — Ambulatory Visit
Admission: RE | Admit: 2013-12-09 | Discharge: 2013-12-09 | Disposition: A | Discharge status: Home / Self Care | Payer: 59 | Source: Ambulatory Visit | Attending: Specialist | Admitting: Specialist

## 2013-12-09 ENCOUNTER — Ambulatory Visit
Admission: RE | Admit: 2013-12-09 | Discharge: 2013-12-09 | Disposition: A | Discharge status: Home / Self Care | Payer: Self-pay | Source: Ambulatory Visit | Attending: Specialist | Admitting: Specialist

## 2013-12-09 ENCOUNTER — Other Ambulatory Visit: Payer: Self-pay | Admitting: Specialist

## 2013-12-09 VITALS — BP 127/74 | HR 59

## 2013-12-09 DIAGNOSIS — M5010 Cervical disc disorder with radiculopathy, unspecified cervical region: Secondary | ICD-10-CM

## 2013-12-09 DIAGNOSIS — M549 Dorsalgia, unspecified: Secondary | ICD-10-CM

## 2013-12-09 MED ORDER — IOHEXOL 300 MG/ML  SOLN
10.0000 mL | Freq: Once | INTRAMUSCULAR | Status: AC | PRN
  Administered 2013-12-09: 10 mL via INTRATHECAL

## 2013-12-09 MED ORDER — DIAZEPAM 5 MG PO TABS
10.0000 mg | ORAL_TABLET | Freq: Once | ORAL | Status: AC
  Administered 2013-12-09: 10 mg via ORAL

## 2013-12-09 NOTE — Progress Notes (Signed)
Pt states she had been off lexapro and trazodone for the last 3 days. Discharge instructions explained to pt.

## 2021-10-18 ENCOUNTER — Encounter (HOSPITAL_BASED_OUTPATIENT_CLINIC_OR_DEPARTMENT_OTHER): Payer: Self-pay

## 2021-10-18 ENCOUNTER — Emergency Department (HOSPITAL_BASED_OUTPATIENT_CLINIC_OR_DEPARTMENT_OTHER): Payer: Commercial Managed Care - PPO

## 2021-10-18 ENCOUNTER — Other Ambulatory Visit (HOSPITAL_BASED_OUTPATIENT_CLINIC_OR_DEPARTMENT_OTHER): Payer: Self-pay

## 2021-10-18 ENCOUNTER — Emergency Department (HOSPITAL_BASED_OUTPATIENT_CLINIC_OR_DEPARTMENT_OTHER)
Admission: EM | Admit: 2021-10-18 | Discharge: 2021-10-18 | Disposition: A | Discharge status: Home / Self Care | Payer: Commercial Managed Care - PPO | Attending: Emergency Medicine | Admitting: Emergency Medicine

## 2021-10-18 ENCOUNTER — Other Ambulatory Visit: Payer: Self-pay

## 2021-10-18 DIAGNOSIS — I10 Essential (primary) hypertension: Secondary | ICD-10-CM | POA: Insufficient documentation

## 2021-10-18 DIAGNOSIS — Y92018 Other place in single-family (private) house as the place of occurrence of the external cause: Secondary | ICD-10-CM | POA: Insufficient documentation

## 2021-10-18 DIAGNOSIS — S61411A Laceration without foreign body of right hand, initial encounter: Secondary | ICD-10-CM | POA: Insufficient documentation

## 2021-10-18 DIAGNOSIS — M25511 Pain in right shoulder: Secondary | ICD-10-CM | POA: Diagnosis present

## 2021-10-18 DIAGNOSIS — Z79899 Other long term (current) drug therapy: Secondary | ICD-10-CM | POA: Diagnosis not present

## 2021-10-18 DIAGNOSIS — W19XXXA Unspecified fall, initial encounter: Secondary | ICD-10-CM

## 2021-10-18 DIAGNOSIS — W010XXA Fall on same level from slipping, tripping and stumbling without subsequent striking against object, initial encounter: Secondary | ICD-10-CM | POA: Insufficient documentation

## 2021-10-18 DIAGNOSIS — S43014A Anterior dislocation of right humerus, initial encounter: Secondary | ICD-10-CM | POA: Diagnosis not present

## 2021-10-18 MED ORDER — PROPOFOL 10 MG/ML IV BOLUS
INTRAVENOUS | Status: AC | PRN
  Administered 2021-10-18: 40 mg via INTRAVENOUS
  Administered 2021-10-18: 50 mg via INTRAVENOUS
  Administered 2021-10-18: 40 mg via INTRAVENOUS
  Administered 2021-10-18: 60 mg via INTRAVENOUS

## 2021-10-18 MED ORDER — FENTANYL CITRATE PF 50 MCG/ML IJ SOSY
50.0000 ug | PREFILLED_SYRINGE | Freq: Once | INTRAMUSCULAR | Status: AC
  Administered 2021-10-18: 50 ug via INTRAVENOUS
  Filled 2021-10-18: qty 1

## 2021-10-18 MED ORDER — HYDROCODONE-ACETAMINOPHEN 5-325 MG PO TABS
1.0000 | ORAL_TABLET | Freq: Four times a day (QID) | ORAL | 0 refills | Status: AC | PRN
  Filled 2021-10-18: qty 10, 3d supply, fill #0

## 2021-10-18 MED ORDER — PROPOFOL 10 MG/ML IV BOLUS
INTRAVENOUS | Status: AC
  Filled 2021-10-18: qty 20

## 2021-10-18 MED ORDER — MORPHINE SULFATE (PF) 4 MG/ML IV SOLN
4.0000 mg | Freq: Once | INTRAVENOUS | Status: AC
  Administered 2021-10-18: 4 mg via INTRAVENOUS
  Filled 2021-10-18: qty 1

## 2021-10-18 MED ORDER — LIDOCAINE HCL (PF) 1 % IJ SOLN
10.0000 mL | Freq: Once | INTRAMUSCULAR | Status: AC
  Administered 2021-10-18: 10 mL
  Filled 2021-10-18: qty 10

## 2021-10-18 MED ORDER — PROPOFOL 10 MG/ML IV BOLUS
1.0000 mg/kg | Freq: Once | INTRAVENOUS | Status: AC
  Administered 2021-10-18: 100 mg via INTRAVENOUS
  Filled 2021-10-18: qty 20

## 2021-10-18 MED ORDER — ONDANSETRON HCL 4 MG/2ML IJ SOLN
4.0000 mg | Freq: Once | INTRAMUSCULAR | Status: AC
  Administered 2021-10-18: 4 mg via INTRAVENOUS
  Filled 2021-10-18: qty 2

## 2021-10-18 NOTE — ED Provider Notes (Signed)
.  Sedation  Date/Time: 10/18/2021 3:56 PM Performed by: Heide Scales, MD Authorized by: Heide Scales, MD   Consent:    Consent obtained:  Written   Consent given by:  Patient   Risks discussed:  Allergic reaction, inadequate sedation, nausea and respiratory compromise necessitating ventilatory assistance and intubation   Alternatives discussed:  Analgesia without sedation Universal protocol:    Immediately prior to procedure, a time out was called: yes   Pre-sedation assessment:    Time since last food or drink:  Hours   ASA classification: class 1 - normal, healthy patient     Mallampati score:  I - soft palate, uvula, fauces, pillars visible   Pre-sedation assessments completed and reviewed: pre-procedure airway patency not reviewed, pre-procedure cardiovascular function not reviewed, pre-procedure hydration status not reviewed, pre-procedure mental status not reviewed, pre-procedure nausea and vomiting status not reviewed, pre-procedure pain level not reviewed and pre-procedure respiratory function not reviewed   Immediate pre-procedure details:    Reviewed: vital signs     Verified: bag valve mask available, emergency equipment available, intubation equipment available, IV patency confirmed, oxygen available, reversal medications available and suction available   Procedure details (see MAR for exact dosages):    Preoxygenation:  Nasal cannula   Sedation:  Propofol   Intended level of sedation: deep   Analgesia:  Fentanyl   Intra-procedure monitoring:  Blood pressure monitoring, continuous capnometry, frequent LOC assessments, frequent vital sign checks, continuous pulse oximetry and cardiac monitor   Intra-procedure events: none     Total Provider sedation time (minutes):  30 Post-procedure details:    Attendance: Constant attendance by certified staff until patient recovered     Recovery: Patient returned to pre-procedure baseline     Patient is stable for  discharge or admission: yes     Procedure completion:  Tolerated well, no immediate complications Comments:     Patient initially had propofol pushed but the IV infiltrated.  It was restarted and she was sedated without difficulty.  Area of extravasation had ice pack placed and she had improvement in discomfort.  No evidence of compartment syndrome with normal pulses distally. Reduction of dislocation  Date/Time: 10/18/2021 3:58 PM Performed by: Heide Scales, MD Authorized by: Heide Scales, MD  Consent: Written consent obtained. Risks and benefits: risks, benefits and alternatives were discussed Consent given by: patient Patient understanding: patient states understanding of the procedure being performed Patient consent: the patient's understanding of the procedure matches consent given Imaging studies: imaging studies available Patient identity confirmed: verbally with patient and arm band Time out: Immediately prior to procedure a "time out" was called to verify the correct patient, procedure, equipment, support staff and site/side marked as required. Local anesthesia used: no  Anesthesia: Local anesthesia used: no  Sedation: Patient sedated: yes  Patient tolerance: patient tolerated the procedure well with no immediate complications      Trever Streater, Canary Brim, MD 10/18/21 1559

## 2021-10-18 NOTE — ED Notes (Signed)
Paper consent completed by Dr Rush Landmark and witnessed by this Clinical research associate.

## 2021-10-18 NOTE — Sedation Documentation (Signed)
Unable to assess pain during procedure

## 2021-10-18 NOTE — ED Notes (Signed)
Lidocaine at bedside.

## 2021-10-18 NOTE — ED Notes (Signed)
Assume care.

## 2021-10-18 NOTE — ED Notes (Signed)
IV infiltrated. Will obtain new IV access

## 2021-10-18 NOTE — Sedation Documentation (Signed)
Ice pack applied to upper left arm at site of infiltration

## 2021-10-18 NOTE — Discharge Instructions (Addendum)
You have an anterior shoulder dislocation, but no fractures were noted.  Please remain in splint until you are seen by orthopedics.  Call tomorrow to schedule follow-up appointment with your orthopedist.  The sutures on your right hand will need to be removed in 7-10 days, this can be done at urgent care, your PCPs office or the emergency department.  Keep this laceration dry for 24 hours after that you can wash once daily with soap and water.  Monitor for redness, swelling or drainage, as this would suggest infection.  Your shoulder will likely still be sore over the next few days.  You can use ibuprofen every 6 hours and you can use prescribed hydrocodone for severe breakthrough pain as needed.  Use this medication as it can cause drowsiness.  Do not drive while taking this medication.

## 2021-10-18 NOTE — ED Provider Notes (Signed)
[facility] EM[facility]ENC[facility] DEPART[facility] Provider Note   CSN: [id number] Arrival date & time: 10/18/21  1148     History Chief Complaint  Patient presents with   Meredith Mccormick    Meredith Mccormick is a 65 y.o. female.  Meredith Mccormick is a 65 y.o. female with a history of hypertension and depression, who presents to the emergency department via EMS for evaluation after an unwitnessed fall.  Patient reports that she was walking back to her office from her kitchen when she tripped over something in her living room causing her to fall.  She fell with her right arm extended and slid on her right side.  She reports that something caught her fifth finger on her extended right arm and pulled it causing a laceration in between her fourth and fifth fingers.  Bleeding is controlled.  She reports since the fall she has been having severe pain primarily in her right shoulder which she is unable to move.  She also reports some more mild right knee pain.  After EMS arrived and they were able to to get her up off the floor and then she was able to walk to the stretcher.  She denies hitting her head during the fall, no loss of consciousness.  Denies any pain in her neck or back.  No pain over her left upper and lower extremities.  Patient is not on any blood thinners.  No other aggravating or alleviating factors.  The history is provided by the patient, medical records and the EMS personnel.      Past Medical History:  Diagnosis Date   Depression    Hypertension     Patient Active Problem List   Diagnosis Date Noted   Depression     Past Surgical History:  Procedure Laterality Date   ABDOMINAL HYSTERECTOMY     CERVICAL SPINE SURGERY       OB History   No obstetric history on file.     No family history on file.  Social History   Tobacco Use   Smoking status: Never   Smokeless tobacco: Never  Vaping Use   Vaping Use: Never used  Substance Use Topics   Alcohol use: Yes    Comment:  daily   Drug use: No    Home Medications Prior to Admission medications   Medication Sig Start Date End Date Taking? Authorizing Provider  escitalopram (LEXAPRO) 20 MG tablet Take 20 mg by mouth daily.      [provider]  hydrochlorothiazide (HYDRODIURIL) 12.5 MG tablet Take 12.5 mg by mouth daily.      [provider]  nebivolol (BYSTOLIC) 5 MG tablet Take 5 mg by mouth daily.      [provider]  traZODone (DESYREL) 50 MG tablet Take 50 mg by mouth at bedtime.      [provider]    Allergies    Penicillins and Sulfa antibiotics  Review of Systems   Review of Systems  Constitutional:  Negative for chills and fever.  HENT: Negative.    Eyes:  Negative for visual disturbance.  Respiratory:  Negative for cough and shortness of breath.   Cardiovascular:  Negative for chest pain.  Gastrointestinal:  Negative for abdominal pain, nausea and vomiting.  Musculoskeletal:  Positive for arthralgias and joint swelling. Negative for back pain and neck pain.  Skin:  Positive for wound. Negative for color change and rash.  Neurological:  Negative for weakness, numbness and headaches.  All other  systems reviewed and are negative.  Physical Exam Updated Vital Signs BP (!) 148/86 (BP Location: Left Arm)   Pulse 80   Temp 98.9 F (37.2 C) (Oral)   Resp 16   Ht 5\' 6"  (1.676 m)   Wt 102.1 kg   SpO2 95%   BMI 36.32 kg/m   Physical Exam Vitals and nursing note reviewed.  Constitutional:      General: She is not in acute distress.    Appearance: Normal appearance. She is well-developed. She is not ill-appearing or diaphoretic.  HENT:     Head: Normocephalic and atraumatic.     Comments: No hematoma, step-off or deformity, no signs of head trauma.    Mouth/Throat:     Mouth: Mucous membranes are moist.     Pharynx: Oropharynx is clear.  Eyes:     General:        Right eye: No discharge.        Left eye: No discharge.     Pupils: Pupils are  equal, round, and reactive to light.  Neck:     Comments: No midline c-spine tenderness, normal ROM Cardiovascular:     Rate and Rhythm: Normal rate and regular rhythm.     Pulses: Normal pulses.     Heart sounds: Normal heart sounds.  Pulmonary:     Effort: Pulmonary effort is normal. No respiratory distress.     Breath sounds: Normal breath sounds. No wheezing or rales.     Comments: Respirations equal and unlabored, patient able to speak in full sentences, lungs clear to auscultation bilaterally, no chest wall tenderness, no crepitus or deformity. Abdominal:     General: Bowel sounds are normal. There is no distension.     Palpations: Abdomen is soft. There is no mass.     Tenderness: There is no abdominal tenderness. There is no guarding.     Comments: Abdomen soft, nondistended, nontender to palpation in all quadrants without guarding or peritoneal signs  Musculoskeletal:        General: Tenderness and deformity present.     Cervical back: Neck supple.     Comments: Tenderness and deformity over the right shoulder, patient is unable to move it without severe pain.  She does not have tenderness or deformity at the elbow and is able to flex and extend the elbow.  Mild tenderness at the wrist and hand with 3 cm laceration extending between the fourth and fifth finger, bleeding controlled.  No visible foreign body.  Patient is able to fully flex and extend all fingers.  Sensation intact.  2+ radial All other joints supple and easily movable, all compartments soft.  No midline spinal tenderness.  Skin:    General: Skin is warm and dry.     Capillary Refill: Capillary refill takes less than 2 seconds.  Neurological:     Mental Status: She is alert and oriented to person, place, and time.     Coordination: Coordination normal.     Comments: Speech is clear, able to follow commands CN III-XII intact Normal strength in upper and lower extremities bilaterally including dorsiflexion and  plantar flexion, strong and equal grip strength Sensation normal to light and sharp touch Moves extremities without ataxia, coordination intact Normal finger to nose and rapid alternating movements  Psychiatric:        Mood and Affect: Mood normal.        Behavior: Behavior normal.    ED Results / Procedures / Treatments   Labs (  all labs ordered are listed, but only abnormal results are displayed) Labs Reviewed - No data to display  EKG None  Radiology DG Chest 2 View  Result Date: 10/18/2021 CLINICAL DATA:  Fall today injuring RIGHT upper extremity EXAM: CHEST - 2 VIEW COMPARISON:  None FINDINGS: Enlargement of cardiac silhouette. Tortuous aorta. Mediastinal contours and pulmonary vascularity otherwise normal. Minimal bibasilar atelectasis. Lungs otherwise clear. No infiltrate, pleural effusion, or pneumothorax. Osseous demineralization with evidence of prior cervical spine fusion. IMPRESSION: Enlargement of cardiac silhouette with minimal bibasilar atelectasis. Electronically Signed   By: Lavonia Dana M.D.   On: 10/18/2021 13:29   DG Shoulder Right  Result Date: 10/18/2021 CLINICAL DATA:  Trauma, fall EXAM: RIGHT SHOULDER - 2+ VIEW COMPARISON:  None. FINDINGS: There is anterior subcoracoid dislocation of right shoulder. No definite fracture lines are seen. There is previous surgical fusion in cervical spine. IMPRESSION: Anterior dislocation of right shoulder. Electronically Signed   By: Elmer Picker M.D.   On: 10/18/2021 13:16   DG Wrist Complete Right  Result Date: 10/18/2021 CLINICAL DATA:  Fall today injuring RIGHT upper extremity EXAM: RIGHT WRIST - COMPLETE 3+ VIEW COMPARISON:  None FINDINGS: Osseous mineralization normal. Joint spaces preserved. No fracture, dislocation, or bone destruction. IMPRESSION: Normal exam. Electronically Signed   By: Lavonia Dana M.D.   On: 10/18/2021 13:16   DG Shoulder Right Portable  Result Date: 10/18/2021 CLINICAL DATA:  Post reduction  right shoulder images. EXAM: PORTABLE RIGHT SHOULDER COMPARISON:  Pre reduction images, 10/18/2021 at 12:39 p.m. FINDINGS: Humeral head has been reduced into normal alignment with the glenoid. No fracture. IMPRESSION: Successful reduction of the dislocated right shoulder. Electronically Signed   By: Lajean Manes M.D.   On: 10/18/2021 15:29   DG Knee Complete 4 Views Right  Result Date: 10/18/2021 CLINICAL DATA:  Fall today, RIGHT knee pain with swelling EXAM: RIGHT KNEE - COMPLETE 4+ VIEW COMPARISON:  12/13/2020 FINDINGS: Osseous demineralization. Joint space narrowing at medial compartment and patellofemoral joint. No acute fracture, dislocation, or bone destruction. No joint effusion. 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 No acute osseous abnormalities. Electronically Signed   By: Lavonia Dana M.D.   On: 10/18/2021 13:26   DG Hand Complete Right  Result Date: 10/18/2021 CLINICAL DATA:  Fall fall today injuring RIGHT upper extremity EXAM: RIGHT HAND - COMPLETE 3+ VIEW COMPARISON:  None FINDINGS: Osseous mineralization normal. Mild joint space narrowing at the scaphotrapezium joint. Joint spaces otherwise preserved. No acute fracture, dislocation, or bone destruction. IMPRESSION: No acute osseous abnormalities. Electronically Signed   By: Lavonia Dana M.D.   On: 10/18/2021 13:17    Procedures .Marland KitchenLaceration Repair  Date/Time: 10/18/2021 4:37 PM Performed by: Jacqlyn Larsen, PA-C Authorized by: Jacqlyn Larsen, PA-C   Consent:    Consent obtained:  Verbal   Consent given by:  Patient   Risks discussed:  Infection, pain, retained foreign body, need for additional repair, poor cosmetic result and nerve damage   Alternatives discussed:  No treatment Universal protocol:    Procedure explained and questions answered to patient or proxy's satisfaction: yes     Test results available: yes     Patient identity confirmed:  Verbally with patient Anesthesia:     Anesthesia method:  Local infiltration   Local anesthetic:  Lidocaine 1% w/o epi Laceration details:    Location:  Hand   Hand location:  R palm   Length (cm):  3   Depth (mm):  5 Pre-procedure  details:    Preparation:  Patient was prepped and draped in usual sterile fashion and imaging obtained to evaluate for foreign bodies Exploration:    Hemostasis achieved with:  Direct pressure   Imaging obtained: x-ray     Imaging outcome: foreign body not noted     Wound exploration: wound explored through full range of motion and entire depth of wound visualized     Wound extent: areolar tissue violated   Treatment:    Area cleansed with:  Saline   Amount of cleaning:  Standard   Irrigation solution:  Sterile saline   Debridement:  None   Undermining:  None Skin repair:    Repair method:  Sutures   Suture size:  4-0   Suture material:  Prolene   Suture technique:  Simple interrupted   Number of sutures:  6   Medications Ordered in ED Medications  propofol (DIPRIVAN) 10 mg/mL bolus/IV push (has no administration in time range)  fentaNYL (SUBLIMAZE) injection 50 mcg (50 mcg Intravenous Given 10/18/21 1219)  lidocaine (PF) (XYLOCAINE) 1 % injection 10 mL (10 mLs Infiltration Given by Other 10/18/21 1222)  fentaNYL (SUBLIMAZE) injection 50 mcg (50 mcg Intravenous Given 10/18/21 1350)  propofol (DIPRIVAN) 10 mg/mL bolus/IV push 102.1 mg (100 mg Intravenous Given by Other 10/18/21 1426)  propofol (DIPRIVAN) 10 mg/mL bolus/IV push (40 mg Intravenous Given 10/18/21 1507)  morphine 4 MG/ML injection 4 mg (4 mg Intravenous Given 10/18/21 1617)  ondansetron (ZOFRAN) injection 4 mg (4 mg Intravenous Given 10/18/21 1616)    ED Course  I have reviewed the triage vital signs and the nursing notes.  Pertinent labs & imaging results that were available during my care of the patient were reviewed by me and considered in my medical decision making (see chart for details).    MDM Rules/Calculators/A&P                            65 year old female presents after mechanical fall.  Falling on her right side with pain and deformity to the right shoulder and pain and laceration to the right hand.  Also complaining of some more mild right wrist and knee pain.  No head injury or loss of consciousness, not on anticoagulation.  No midline spinal tenderness.  Will get plain films of the right shoulder, right hand and wrist as well as right knee as well as chest x-ray.  IV pain medication given.  Tetanus is up-to-date.  Bleeding controlled from the laceration.  No evidence of tendon involvement, sensory deficit or foreign body.  X-ray to assess for underlying fracture.  I have independently ordered, reviewed and interpreted all imaging:  X-rays of the right shoulder show an anterior dislocation, no associated fracture.   The rest of patient's imaging is reassuring with no evidence of fracture in the right hand or wrist and right knee.  Chest x-ray with no acute disease as well.  Patient underwent procedural sedation with propofol, please see Dr. Doreene Burke separate documentation.  Successful reduction of anterior dislocation and sling applied.  Laceration of the right hand was repaired with simple interrupted sutures with good cosmesis and hemostasis.  Nonadherent dressing applied.  Discussed wound care extensively with patient as well as suture removal in 7-10 days and return precautions.  Patient will need to remain in sling until she is seen by orthopedics.  She has previously been followed by [facility] for other orthopedic injuries and will follow up  with them.  Will prescribe pain medication encourage patient to use NSAIDs in addition to this as well as ice as needed for pain and swelling.  At this time patient is stable for discharge home with her son.  She is alert and has returned to baseline after procedural sedation.  Expresses understanding and agreement with all discharge  instructions.   Final Clinical Impression(s) / ED Diagnoses Final diagnoses:  Fall, initial encounter  Anterior dislocation of right shoulder, initial encounter  Laceration of right hand without foreign body, initial encounter    Rx / DC Orders ED Discharge Orders          Ordered    HYDROcodone-acetaminophen (NORCO) 5-325 MG tablet  Every 6 hours PRN        10/18/21 1639             Dartha Lodge, PA-C 10/18/21 2007    Tegeler, Canary Brim, MD 10/20/21 [phone]

## 2021-10-18 NOTE — Sedation Documentation (Signed)
Shoulder appears to be reduced, Calling Xray for confirmation

## 2021-10-18 NOTE — Progress Notes (Signed)
RT present for sedation. Patient on ETCO2 and required 4L during sedation. Tolerated well and awake at this time. RT to monitor.

## 2021-10-18 NOTE — ED Triage Notes (Signed)
Pt arrives via [facility] from home after unwitnessed fall. VS with EMS 156/88, HR 76, 16 RR, 95% RA. Pt states she tripped over something in her house causing her to fall with her right arm extended out, c/o pain to right arm and shoulder. During fall pt split area between pinky and ring finger on right hand with pain also in right knee. Denies being on blood thinners, denies LOC.

## 2021-10-18 NOTE — ED Notes (Signed)
Pt transported home by son.

## 2021-12-28 ENCOUNTER — Encounter: Payer: Self-pay | Admitting: Physical Therapy

## 2021-12-28 ENCOUNTER — Other Ambulatory Visit: Payer: Self-pay

## 2021-12-28 ENCOUNTER — Ambulatory Visit: Payer: Commercial Managed Care - PPO | Attending: Orthopedic Surgery | Admitting: Physical Therapy

## 2021-12-28 DIAGNOSIS — M25611 Stiffness of right shoulder, not elsewhere classified: Secondary | ICD-10-CM | POA: Diagnosis present

## 2021-12-28 DIAGNOSIS — M6281 Muscle weakness (generalized): Secondary | ICD-10-CM | POA: Diagnosis present

## 2021-12-28 DIAGNOSIS — R29898 Other symptoms and signs involving the musculoskeletal system: Secondary | ICD-10-CM | POA: Insufficient documentation

## 2021-12-28 DIAGNOSIS — M25511 Pain in right shoulder: Secondary | ICD-10-CM | POA: Insufficient documentation

## 2021-12-28 DIAGNOSIS — R293 Abnormal posture: Secondary | ICD-10-CM | POA: Insufficient documentation

## 2021-12-28 NOTE — Patient Instructions (Signed)
° ° °  Access Code: [email or web address] URL: [email or web address] Date: 12/28/2021 Prepared by: Glenetta Hew  Exercises Flexion-Extension Shoulder Pendulum with Table Support - 3 x daily - 7 x weekly - 2 sets - 10 reps Horizontal Shoulder Pendulum with Table Support - 3 x daily - 7 x weekly - 2 sets - 10 reps Circular Shoulder Pendulum with Table Support - 3 x daily - 7 x weekly - 2 sets - 10 reps Seated Forearm Pronation Supination AROM - 3 x daily - 7 x weekly - 2 sets - 10 reps - 3 sec hold Wrist Flexion AROM - 3 x daily - 7 x weekly - 2 sets - 10 reps - 3 sec hold Wrist Extension AROM - 3 x daily - 7 x weekly - 2 sets - 10 reps - 3 sec hold Towel Roll Squeeze - 3 x daily - 7 x weekly - 2 sets - 10 reps - 3 sec hold

## 2021-12-28 NOTE — Therapy (Signed)
[facility] [address]  [address] [place], [place], [address] Phone: [phone]   Fax:  [phone]  Physical Therapy Evaluation  Patient Details  Name: Meredith Mccormick MRN: [id number] Date of Birth: [birth year] Referring Provider (PT): Francena Hanly, MD   Encounter Date: 12/28/2021   PT End of Session - 12/28/21 1445     Visit Number 1    Date for PT Re-Evaluation 03/22/22    Authorization Type UHC UMR    PT Start Time 1445    PT Stop Time 1541    PT Time Calculation (min) 56 min    Activity Tolerance Patient tolerated treatment well;Patient limited by pain    Behavior During Therapy [facility] for tasks assessed/performed             Past Medical History:  Diagnosis Date   Depression    Hypertension     Past Surgical History:  Procedure Laterality Date   ABDOMINAL HYSTERECTOMY     CERVICAL SPINE SURGERY      There were no vitals filed for this visit.    Subjective Assessment - 12/28/21 1450     Subjective Pt reports injury to R shoulder resulted from trip and fall at home on 10/18/21. In additon to shoulder injury she split her hand btw pinky finger and the remaining fingers. Pt reports pain post-op has been no more severe than pre-op and she has only been taking pain meds on a PRN basis.    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 Yes    Pain Score 2    at rest, 3/10 with movement   Pain Location Shoulder   and upper inner arm (biceps)   Pain Orientation Right    Pain Descriptors / Indicators Other (Comment)   "searing"   Pain Type Surgical pain;Acute pain    Pain Onset 1 to 4 weeks ago    Pain Frequency Intermittent    Aggravating Factors  movement of R UE, esp if she has to bend over    Pain Relieving Factors pain meds PRN, ice/compression machine                 [facility] PT Assessment - 12/28/21 1445       Assessment   Medical Diagnosis R RCR + bicpes tenodesis    Referring Provider (PT) Francena Hanly, MD    Onset Date/Surgical Date 12/22/21    Hand Dominance Right    Next MD Visit 01/02/22    Prior Therapy none      Precautions   Precautions Shoulder    Shoulder Interventions Shoulder abduction pillow;Don joy ultra sling;At all times;Off for dressing/bathing/exercises    Precaution Comments Avoid elbow extension >45 and perform shoulder exercises with elbow at 90 x 6 wks post-op. No isolated biceps exercises until 8 wks post-op.    Required Braces or Orthoses Sling      Restrictions   Weight Bearing Restrictions Yes    RUE Weight Bearing Non weight bearing      Balance Screen   Has the patient fallen in the past 6 months Yes    How many times? 2    Has the patient had a decrease in activity level because of a fear of falling?  No    Is the patient reluctant to  leave their home because of a fear of falling?  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    Leisure reorganizing her house, would like to get into reading more and scrapbooking; no regular exercise      Cognition   Overall Cognitive Status Within Functional Limits for tasks assessed      Observation/Other Assessments   Focus on Therapeutic Outcomes (FOTO)  Shoulder = 4; predicted D/C FS = 55      Posture/Postural Control   Posture/Postural Control Postural limitations    Postural Limitations Forward head;Rounded Shoulders;Increased thoracic kyphosis      ROM / Strength   AROM / PROM / Strength PROM      PROM   PROM Assessment Site  Shoulder    Right/Left Shoulder Right    Right Shoulder Flexion 88 Degrees    Right Shoulder ABduction 67 Degrees    Right Shoulder Internal Rotation 39 Degrees    Right Shoulder External Rotation 18 Degrees                        Objective measurements completed on examination: See above findings.       [facility] Adult PT Treatment/Exercise - 12/28/21 1445       Exercises   Exercises Shoulder      Elbow Exercises   Forearm Supination Right;5 reps;AROM;Seated    Forearm Supination Limitations elbow flexed to 90 and forearm supported on pillow    Forearm Pronation Right;5 reps;AROM;Seated    Forearm Pronation Limitations elbow flexed to 90 and forearm supported on pillow      Shoulder Exercises: Supine   External Rotation Right;10 reps;PROM    Internal Rotation Right;10 reps;PROM    Flexion Right;10 reps;PROM    ABduction Right;10 reps;PROM      Shoulder Exercises: ROM/Strengthening   Pendulum R shoulder flex/ext, horiz ABD/ADD and CW/CCW with elbow flexed x 10 each      Hand Exercises   Other Hand Exercises R hand squeezes x 10      Wrist Exercises   Wrist Flexion Right;5 reps;AROM;Seated    Wrist Flexion Limitations elbow flexed to 90 and forearm supported on pillow    Wrist Extension Right;5 reps;AROM;Seated    Wrist Extension Limitations elbow flexed to 90 and forearm supported on pillow      Manual Therapy   Manual Therapy Passive ROM    Passive ROM R shoulder gentle PROM to tolerance                     PT Education - 12/28/21 1538     Education Details PT eval findings, anticipated POC and initial HEP - Access Code: [email or web address]    Person(s) Educated Patient    Methods Explanation;Demonstration;Verbal cues;Handout    Comprehension Verbalized understanding;Verbal cues required;Returned demonstration;Need further instruction              PT Short Term Goals - 12/28/21 1541       PT SHORT TERM GOAL #1   Title Patient  will be  independent with initial HEP    Status New    Target Date 01/25/22               PT Long Term Goals - 12/28/21 1541       PT LONG TERM GOAL #1   Title Patient will be independent with ongoing/advanced HEP for self-management at home    Status New    Target Date 03/22/22      PT LONG TERM GOAL #2   Title Patient to improve R shoulder AROM to WFL/WNL (flexion 160, abduction 140 and ER 75) without pain provocation    Status New    Target Date 03/22/22      PT LONG TERM GOAL #3   Title Patient will demonstrate improved R shoulder strength to >/= 4/5 to 4+/5 for functional UE use    Status New    Target Date 03/22/22      PT LONG TERM GOAL #4   Title Patient to demonstrate R shoulder elevation to overhead shelf with 3-5# with good scapular mechanics and no evidence of compensations    Status New    Target Date 03/22/22      PT LONG TERM GOAL #5   Title Patient to report ability to perform ADLs, household, and work-related tasks including computer and mouse use without limitation due to R shoulder pain, LOM or weakness    Status New    Target Date 03/22/22                    Plan - 12/28/21 1541     Clinical Impression Statement Meredith Mccormick is a 66 y/o female who presents to OP PT 1 week s/p R RCR with biceps tenodesis on 12/22/21. R shoulder pain currently 2/10 at rest and increases to 3/10 with movement, and pt reports she has been able to manage pain with ice/compression and occasional prescription pain meds. She reports good adherence to sling wearing schedule, only removing sling for bathing/dressing and exercises. Deficits include increased muscle guarding in R upper shoulder, abnormal posture and reduced PROM R shoulder. Reviewed pendulum exercises initiated post-op and provided education in distal UE ROM exercises avoiding active elbow ROM and maintaining 90 elbow flexion during PROM of shoulder. Meredith Mccormick will benefit from skilled PT to address above deficits  and restore functional ROM and strength in her R shoulder to allow her to resume normal daily activities without pain interference.    Personal Factors and Comorbidities Age;Comorbidity 3+;Fitness;Past/Current Experience;Time since onset of injury/illness/exacerbation    Comorbidities Cervical fusion, HTN, depression, anxiety, BMI > 30, 3 falls in past year    Examination-Activity Limitations Bathing;Bed Mobility;Dressing;Hygiene/Grooming;Lift;Carry;Reach Overhead;Self Feeding;Sleep;Toileting;Transfers    Examination-Participation Restrictions Cleaning;Community Activity;Driving;Laundry;Meal Prep;Occupation;Shop    Stability/Clinical Decision Making Stable/Uncomplicated    Clinical Decision Making Low    Rehab Potential Good    PT Frequency 1x / week   1x/wk x 4 weeks during PROM phase, likely increasing frequency to 2-3x/wk as pt able to initiate AAROM phase   PT Duration 12 weeks    PT Treatment/Interventions ADLs/Self Care Home Management;Cryotherapy;Electrical Stimulation;Moist Heat;Ultrasound;Functional mobility training;Therapeutic activities;Therapeutic exercise;Neuromuscular re-education;Patient/family education;Manual techniques;Scar mobilization;Passive range of motion;Dry needling;Taping;Vasopneumatic Device;Joint Manipulations    PT Next Visit Plan Review initial HEP; gentle L shoulder PROM; initiate glenohumeral and scapulothoracic mobilization; Rehab per RCR + Biceps tenodesis protocol - post-op week #1 as of 12/29/21 (sx 12/22/21); Avoid elbow extension >45 and perform shoulder exercises with elbow at 90 x 6 wks post-op.  No isolated biceps exercises until 8 wks post-op.    PT Home Exercise Plan Access Code: [email or web address] (1/12)    Consulted and Agree with Plan of Care Patient             Patient will benefit from skilled therapeutic intervention in order to improve the following deficits and impairments:  Decreased activity tolerance, Decreased knowledge of precautions, Decreased  mobility, Decreased range of motion, Decreased scar mobility, Decreased strength, Increased edema, Increased fascial restricitons, Increased muscle spasms, Impaired perceived functional ability, Impaired flexibility, Impaired UE functional use, Improper body mechanics, Postural dysfunction, Pain  Visit Diagnosis: Stiffness of right shoulder, not elsewhere classified  Acute pain of right shoulder  Muscle weakness (generalized)  Abnormal posture  Other symptoms and signs involving the musculoskeletal system     Problem List Patient Active Problem List   Diagnosis Date Noted   Depression     Marry GuanJoAnne M Tamer Baughman, PT 12/28/2021, 7:24 PM  [facility] [place] [address]  [address] [place], [place], [address] Phone: [phone]   Fax:  [phone]  Name: Meredith Mccormick MRN: [id number] Date of Birth: 03/31/1956

## 2022-01-04 ENCOUNTER — Encounter: Payer: Self-pay | Admitting: Physical Therapy

## 2022-01-04 ENCOUNTER — Other Ambulatory Visit: Payer: Self-pay

## 2022-01-04 ENCOUNTER — Ambulatory Visit: Payer: Commercial Managed Care - PPO | Admitting: Physical Therapy

## 2022-01-04 DIAGNOSIS — R29898 Other symptoms and signs involving the musculoskeletal system: Secondary | ICD-10-CM

## 2022-01-04 DIAGNOSIS — M25611 Stiffness of right shoulder, not elsewhere classified: Secondary | ICD-10-CM | POA: Diagnosis not present

## 2022-01-04 DIAGNOSIS — R293 Abnormal posture: Secondary | ICD-10-CM

## 2022-01-04 DIAGNOSIS — M25511 Pain in right shoulder: Secondary | ICD-10-CM

## 2022-01-04 DIAGNOSIS — M6281 Muscle weakness (generalized): Secondary | ICD-10-CM

## 2022-01-04 NOTE — Therapy (Signed)
[facility] [address]  [address] [place], [place], [address] Phone: [phone]   Fax:  [phone]  Physical Therapy Treatment  Patient Details  Name: Meredith Mccormick MRN: [id number] Date of Birth: Jul 16, 1956 Referring Provider (PT): Francena Hanly, MD   Encounter Date: 01/04/2022   PT End of Session - 01/04/22 1450     Visit Number 2    Date for PT Re-Evaluation 03/22/22    Authorization Type UHC UMR    PT Start Time 1450    PT Stop Time 1532    PT Time Calculation (min) 42 min    Activity Tolerance Patient tolerated treatment well    Behavior During Therapy [facility] for tasks assessed/performed             Past Medical History:  Diagnosis Date   Depression    Hypertension     Past Surgical History:  Procedure Laterality Date   ABDOMINAL HYSTERECTOMY     CERVICAL SPINE SURGERY      There were no vitals filed for this visit.   Subjective Assessment - 01/04/22 1500     Subjective Pt reports she had a rough night last night - thinks she slept in an awkward position. Medication list updated to reflect current and post-op meds.    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 No/denies    Pain Onset --                [facility] PT Assessment - 01/04/22 1450       Assessment   Next MD Visit 02/05/22                           [facility] Adult PT Treatment/Exercise - 01/04/22 1450       Exercises   Exercises Shoulder      Elbow Exercises   Forearm Supination Right;10 reps;AROM;Seated    Forearm Supination Limitations elbow flexed to 90 and forearm supported on pillow    Forearm Pronation Right;10 reps;AROM;Seated    Forearm Pronation Limitations elbow flexed to 90 and forearm supported on pillow      Hand Exercises   Other Hand Exercises R hand towel  squeezes 2 x 10      Wrist Exercises   Wrist Flexion Right;10 reps;AROM;Seated    Wrist Flexion Limitations elbow flexed to 90 and forearm supported on pillow    Wrist Extension Right;10 reps;AROM;Seated    Wrist Extension Limitations elbow flexed to 90 and forearm supported on pillow      Manual Therapy   Manual Therapy Joint mobilization;Passive ROM    Joint Mobilization R shoulder grade I-II distraction and oscillation to promote shoulder relaxation and reduce guarding during PROM    Passive ROM gentle R shoulder PROM all directions to tolerance, with elbow flexed to 90 d/t biceps tenodesis                       PT Short Term Goals - 01/04/22 1555       PT SHORT TERM GOAL #1   Title Patient will be independent with initial HEP    Status On-going    Target Date 01/25/22               PT  Long Term Goals - 01/04/22 1555       PT LONG TERM GOAL #1   Title Patient will be independent with ongoing/advanced HEP for self-management at home    Status On-going    Target Date 03/22/22      PT LONG TERM GOAL #2   Title Patient to improve R shoulder AROM to WFL/WNL (flexion 160, abduction 140 and ER 75) without pain provocation    Status On-going    Target Date 03/22/22      PT LONG TERM GOAL #3   Title Patient will demonstrate improved R shoulder strength to >/= 4/5 to 4+/5 for functional UE use    Status On-going    Target Date 03/22/22      PT LONG TERM GOAL #4   Title Patient to demonstrate R shoulder elevation to overhead shelf with 3-5# with good scapular mechanics and no evidence of compensations    Status On-going    Target Date 03/22/22      PT LONG TERM GOAL #5   Title Patient to report ability to perform ADLs, household, and work-related tasks including computer and mouse use without limitation due to R shoulder pain, LOM or weakness    Status On-going    Target Date 03/22/22                   Plan - 01/04/22 1525     Clinical  Impression Statement Meredith Mccormick reports she has to close her eyes during pendulums so she will relax and not try to actively swing her arm but otherwise feels more comfortable with pendulums following review on initial visit. No issues with distal UE AROM exercises other than limited with full ROM into supination which also slightly limits wrist flexion. She notes sometimes her ring finger will get stuck in flexion ROM with some wrist and hand exercises during HEP performance, but this did not occur during review today - she attributes this to the additional trauma she suffered during her fall where she tore the webspace between her 4th and 5th digits. She has some difficulty initially relaxing during shoulder PROM, but this improved after gentle GH mobilization and ROM able to be gradually progressed.    Comorbidities Cervical fusion, HTN, depression, anxiety, BMI > 30, 3 falls in past year    Rehab Potential Good    PT Frequency 1x / week   1x/wk x 4 weeks during PROM phase, likely increasing frequency to 2-3x/wk as pt able to initiate AAROM phase   PT Duration 12 weeks    PT Treatment/Interventions ADLs/Self Care Home Management;Cryotherapy;Electrical Stimulation;Moist Heat;Ultrasound;Functional mobility training;Therapeutic activities;Therapeutic exercise;Neuromuscular re-education;Patient/family education;Manual techniques;Scar mobilization;Passive range of motion;Dry needling;Taping;Vasopneumatic Device;Joint Manipulations    PT Next Visit Plan gentle L shoulder PROM; initiate glenohumeral and scapulothoracic mobilization; Rehab per RCR + Biceps tenodesis protocol - post-op week #2 as of 01/05/22 (sx 12/22/21); Avoid elbow extension >45 and perform shoulder exercises with elbow at 90 x 6 wks post-op. No isolated biceps exercises until 8 wks post-op.    PT Home Exercise Plan Access Code: [email or web address] (1/12)    Consulted and Agree with Plan of Care Patient             Patient will benefit from skilled  therapeutic intervention in order to improve the following deficits and impairments:  Decreased activity tolerance, Decreased knowledge of precautions, Decreased mobility, Decreased range of motion, Decreased scar mobility, Decreased strength, Increased edema, Increased fascial restricitons, Increased muscle spasms, Impaired perceived functional ability,  Impaired flexibility, Impaired UE functional use, Improper body mechanics, Postural dysfunction, Pain  Visit Diagnosis: Stiffness of right shoulder, not elsewhere classified  Acute pain of right shoulder  Muscle weakness (generalized)  Abnormal posture  Other symptoms and signs involving the musculoskeletal system     Problem List Patient Active Problem List   Diagnosis Date Noted   Depression     Marry GuanJoAnne M Carletha Dawn, PT 01/04/2022, 3:55 PM  [facility] [address]  [address] [place], [place], [address] Phone: [phone]   Fax:  [phone]  Name: Meredith Mccormick MRN: [id number] Date of Birth: 07/01/1956

## 2022-01-11 ENCOUNTER — Ambulatory Visit: Payer: Commercial Managed Care - PPO | Admitting: Physical Therapy

## 2022-01-11 ENCOUNTER — Other Ambulatory Visit: Payer: Self-pay

## 2022-01-11 DIAGNOSIS — R29898 Other symptoms and signs involving the musculoskeletal system: Secondary | ICD-10-CM

## 2022-01-11 DIAGNOSIS — M6281 Muscle weakness (generalized): Secondary | ICD-10-CM

## 2022-01-11 DIAGNOSIS — R293 Abnormal posture: Secondary | ICD-10-CM

## 2022-01-11 DIAGNOSIS — M25611 Stiffness of right shoulder, not elsewhere classified: Secondary | ICD-10-CM

## 2022-01-11 DIAGNOSIS — M25511 Pain in right shoulder: Secondary | ICD-10-CM

## 2022-01-11 NOTE — Therapy (Signed)
[facility] [address]  [address] [place], [place], [address] Phone: [phone]   Fax:  [phone]  Physical Therapy Treatment  Patient Details  Name: Meredith Mccormick MRN: [id number] Date of Birth: 1956/01/31 Referring Provider (PT): Francena Hanly, MD   Encounter Date: 01/11/2022   PT End of Session - 01/11/22 1436     Visit Number 3    Date for PT Re-Evaluation 03/22/22    Authorization Type UHC UMR    PT Start Time 1435    PT Stop Time 1518    PT Time Calculation (min) 43 min    Activity Tolerance Patient tolerated treatment well    Behavior During Therapy [facility] for tasks assessed/performed             Past Medical History:  Diagnosis Date   Depression    Hypertension     Past Surgical History:  Procedure Laterality Date   ABDOMINAL HYSTERECTOMY     CERVICAL SPINE SURGERY      There were no vitals filed for this visit.   Subjective Assessment - 01/11/22 1436     Subjective Still having a lot of pain. It's intermittent.    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 Yes    Pain Score 3     Pain Location Shoulder    Pain Orientation Right                [facility] PT Assessment - 01/11/22 0001       PROM   PROM Assessment Site Shoulder    Right/Left Shoulder Right    Right Shoulder Flexion 118 Degrees    Right Shoulder ABduction 100 Degrees    Right Shoulder Internal Rotation 45 Degrees   at 45 deg ABD   Right Shoulder External Rotation 23 Degrees   at 45 deg ABD                          [facility] Adult PT Treatment/Exercise - 01/11/22 0001       Hand Exercises   MCPJ Extension Self ROM;Right;5 reps    MCPJ Extension Limitations digits 4 and 5 x 20 sec hold    Opposition AROM;Right;Other reps (comment)   2 reps each with isometric hold   Digit Abduction/Adduction 10 reps right hand    Other Hand  Exercises R hand towel squeezes 2 x 10      Wrist Exercises   Wrist Flexion Right;10 reps;AROM;Seated    Wrist Flexion Limitations elbow flexed to 90 and forearm supported on pillow    Wrist Extension Right;10 reps;AROM;Seated    Wrist Extension Limitations elbow flexed to 90 and forearm supported on pillow      Manual Therapy   Manual Therapy Joint mobilization;Soft tissue mobilization;Passive ROM    Joint Mobilization R shoulder grade I-II distraction and oscillation to promote shoulder relaxation and reduce guarding during PROM    Soft tissue mobilization to R digits 4/5 lumbricals    Passive ROM gentle R shoulder PROM all directions to tolerance, with elbow flexed to 90 d/t biceps tenodesis                       PT Short Term Goals - 01/04/22 1555       PT SHORT TERM GOAL #1   Title Patient will  be independent with initial HEP    Status On-going    Target Date 01/25/22               PT Long Term Goals - 01/04/22 1555       PT LONG TERM GOAL #1   Title Patient will be independent with ongoing/advanced HEP for self-management at home    Status On-going    Target Date 03/22/22      PT LONG TERM GOAL #2   Title Patient to improve R shoulder AROM to WFL/WNL (flexion 160, abduction 140 and ER 75) without pain provocation    Status On-going    Target Date 03/22/22      PT LONG TERM GOAL #3   Title Patient will demonstrate improved R shoulder strength to >/= 4/5 to 4+/5 for functional UE use    Status On-going    Target Date 03/22/22      PT LONG TERM GOAL #4   Title Patient to demonstrate R shoulder elevation to overhead shelf with 3-5# with good scapular mechanics and no evidence of compensations    Status On-going    Target Date 03/22/22      PT LONG TERM GOAL #5   Title Patient to report ability to perform ADLs, household, and work-related tasks including computer and mouse use without limitation due to R shoulder pain, LOM or weakness     Status On-going    Target Date 03/22/22                   Plan - 01/11/22 1530     Clinical Impression Statement Patient tolerated PROM very well today. She was able to relax more with minimal VCs from PT. She has improved in all planes. Encouraged pt to work on digits 4 and 5 MCP extension as part of HEP.    PT Frequency 1x / week    PT Duration 12 weeks    PT Treatment/Interventions ADLs/Self Care Home Management;Cryotherapy;Electrical Stimulation;Moist Heat;Ultrasound;Functional mobility training;Therapeutic activities;Therapeutic exercise;Neuromuscular re-education;Patient/family education;Manual techniques;Scar mobilization;Passive range of motion;Dry needling;Taping;Vasopneumatic Device;Joint Manipulations    PT Next Visit Plan gentle L shoulder PROM; initiate glenohumeral and scapulothoracic mobilization; Rehab per RCR + Biceps tenodesis protocol - post-op week #2 as of 01/05/22 (sx 12/22/21); Avoid elbow extension >45 and perform shoulder exercises with elbow at 90 x 6 wks post-op. No isolated biceps exercises until 8 wks post-op.    Consulted and Agree with Plan of Care Patient             Patient will benefit from skilled therapeutic intervention in order to improve the following deficits and impairments:  Decreased activity tolerance, Decreased knowledge of precautions, Decreased mobility, Decreased range of motion, Decreased scar mobility, Decreased strength, Increased edema, Increased fascial restricitons, Increased muscle spasms, Impaired perceived functional ability, Impaired flexibility, Impaired UE functional use, Improper body mechanics, Postural dysfunction, Pain  Visit Diagnosis: Stiffness of right shoulder, not elsewhere classified  Acute pain of right shoulder  Muscle weakness (generalized)  Abnormal posture  Other symptoms and signs involving the musculoskeletal system     Problem List Patient Active Problem List   Diagnosis Date Noted    Depression    Solon Palm, PT 01/11/2022, 3:36 PM  [facility] [address]  [address] [place], [place], [address] Phone: [phone]   Fax:  [phone]  Name: Meredith Mccormick MRN: [id number] Date of Birth: [birth year]

## 2022-01-18 ENCOUNTER — Encounter: Payer: Self-pay | Admitting: Physical Therapy

## 2022-01-18 ENCOUNTER — Other Ambulatory Visit: Payer: Self-pay

## 2022-01-18 ENCOUNTER — Ambulatory Visit: Payer: Commercial Managed Care - PPO | Attending: Orthopedic Surgery | Admitting: Physical Therapy

## 2022-01-18 DIAGNOSIS — M25511 Pain in right shoulder: Secondary | ICD-10-CM

## 2022-01-18 DIAGNOSIS — M25611 Stiffness of right shoulder, not elsewhere classified: Secondary | ICD-10-CM

## 2022-01-18 DIAGNOSIS — R293 Abnormal posture: Secondary | ICD-10-CM

## 2022-01-18 DIAGNOSIS — R29898 Other symptoms and signs involving the musculoskeletal system: Secondary | ICD-10-CM | POA: Diagnosis present

## 2022-01-18 DIAGNOSIS — M6281 Muscle weakness (generalized): Secondary | ICD-10-CM | POA: Diagnosis present

## 2022-01-18 NOTE — Therapy (Signed)
[facility] [place] [address]  [address] [place], [place], [address] Phone: [phone]   Fax:  [phone]  Physical Therapy Treatment  Patient Details  Name: Meredith Mccormick MRN: [id number] Date of Birth: 11/28/1956 Referring Provider (PT): Francena HanlyKevin Supple, MD   Encounter Date: 01/18/2022   PT End of Session - 01/18/22 1451     Visit Number 4    Date for PT Re-Evaluation 03/22/22    Authorization Type U[employer] UMR    PT Start Time 1451   Pt arrived late   PT Stop Time 1533    PT Time Calculation (min) 42 min    Activity Tolerance Patient tolerated treatment well    Behavior During Therapy [facility] for tasks assessed/performed             Past Medical History:  Diagnosis Date   Depression    Hypertension     Past Surgical History:  Procedure Laterality Date   ABDOMINAL HYSTERECTOMY     CERVICAL SPINE SURGERY      There were no vitals filed for this visit.   Subjective Assessment - 01/18/22 1451     Subjective Pt reports increased pain this week after having to use her arm to help herself from the floor after she fell on her knees trying to take a picture under her sink before the contractors came this week to redo her kitchen. Despite current exacerbation, she states pain remains significantly less than pre-op.    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 Yes    Pain Score 3     Pain Location Shoulder    Pain Orientation Right    Pain Descriptors / Indicators Spasm    Pain Type Acute pain;Surgical pain    Pain Frequency Intermittent                [facility] PT Assessment - 01/18/22 1451       PROM   Right Shoulder Flexion 122 Degrees    Right Shoulder ABduction 108 Degrees    Right Shoulder Internal Rotation 47 Degrees   at 45 deg ABD   Right Shoulder External Rotation 31 Degrees    at 45 deg ABD                          [facility] Treatment/Exercise - 01/18/22 1451       Exercises   Exercises Shoulder      Manual Therapy   Manual Therapy Joint mobilization;Soft tissue mobilization;Passive ROM    Joint Mobilization R shoulder grade I-II distraction and oscillation to promote shoulder relaxation and reduce guarding during PROM    Soft tissue mobilization STM/DTM to R lateral deltoid, teres group and infraspinatus    Passive ROM gentle R shoulder PROM all directions to tolerance, with elbow flexed to 90 d/t biceps tenodesis                       PT Short Term Goals - 01/18/22 1456       PT SHORT TERM GOAL #1   Title Patient will be independent with initial HEP    Status Achieved   01/18/22              PT Long Term Goals -  01/04/22 1555       PT LONG TERM GOAL #1   Title Patient will be independent with ongoing/advanced HEP for self-management at home    Status On-going    Target Date 03/22/22      PT LONG TERM GOAL #2   Title Patient to improve R shoulder AROM to WFL/WNL (flexion 160, abduction 140 and ER 75) without pain provocation    Status On-going    Target Date 03/22/22      PT LONG TERM GOAL #3   Title Patient will demonstrate improved R shoulder strength to >/= 4/5 to 4+/5 for functional UE use    Status On-going    Target Date 03/22/22      PT LONG TERM GOAL #4   Title Patient to demonstrate R shoulder elevation to overhead shelf with 3-5# with good scapular mechanics and no evidence of compensations    Status On-going    Target Date 03/22/22      PT LONG TERM GOAL #5   Title Patient to report ability to perform ADLs, household, and work-related tasks including computer and mouse use without limitation due to R shoulder pain, LOM or weakness    Status On-going    Target Date 03/22/22                   Plan - 01/18/22 1533     Clinical Impression Statement Meredith Mccormick reports  increased R lateral shoulder pain after having to use her R arm while in her sling to stabilize herself as she got up from the floor following a fall on Saturday. Pain has been subsiding but still somewhat irritable making it more difficult for her to relax during PROM, hence increased focus on STM and gentle joint mobs to promote decreased pain and muscle relaxation. Despite increased pain and difficulty relaxing she was still able to demonstrate at least slight gains in R shoulder ROM in all planes.    Comorbidities Cervical fusion, HTN, depression, anxiety, BMI > 30, 3 falls in past year    Rehab Potential Good    PT Frequency 1x / week    PT Duration 12 weeks    PT Treatment/Interventions ADLs/Self Care Home Management;Cryotherapy;Electrical Stimulation;Moist Heat;Ultrasound;Functional mobility training;Therapeutic activities;Therapeutic exercise;Neuromuscular re-education;Patient/family education;Manual techniques;Scar mobilization;Passive range of motion;Dry needling;Taping;Vasopneumatic Device;Joint Manipulations    PT Next Visit Plan gentle L shoulder PROM; initiate glenohumeral and scapulothoracic mobilization; Rehab per RCR + Biceps tenodesis protocol - post-op week #4 as of 01/19/22 (sx 12/22/21); Avoid elbow extension >45 and perform shoulder exercises with elbow at 90 x 6 wks post-op. No isolated biceps exercises until 8 wks post-op.    PT Home Exercise Plan Access Code: [email or web address] (1/12)    Consulted and Agree with Plan of Care Patient             Patient will benefit from skilled therapeutic intervention in order to improve the following deficits and impairments:  Decreased activity tolerance, Decreased knowledge of precautions, Decreased mobility, Decreased range of motion, Decreased scar mobility, Decreased strength, Increased edema, Increased fascial restricitons, Increased muscle spasms, Impaired perceived functional ability, Impaired flexibility, Impaired UE functional use, Improper  body mechanics, Postural dysfunction, Pain  Visit Diagnosis: Stiffness of right shoulder, not elsewhere classified  Acute pain of right shoulder  Muscle weakness (generalized)  Abnormal posture  Other symptoms and signs involving the musculoskeletal system     Problem List Patient Active Problem List   Diagnosis Date Noted   Depression  Marry Guan, PT 01/18/2022, 7:34 PM  [facility] [address]  [address] [place], [place], [address] Phone: [phone]   Fax:  [phone]  Name: Meredith Mccormick MRN: [id number] Date of Birth: Oct 30, 1956

## 2022-01-25 ENCOUNTER — Encounter: Payer: Self-pay | Admitting: Physical Therapy

## 2022-01-25 ENCOUNTER — Ambulatory Visit: Payer: Commercial Managed Care - PPO | Admitting: Physical Therapy

## 2022-01-25 ENCOUNTER — Other Ambulatory Visit: Payer: Self-pay

## 2022-01-25 DIAGNOSIS — M6281 Muscle weakness (generalized): Secondary | ICD-10-CM

## 2022-01-25 DIAGNOSIS — M25611 Stiffness of right shoulder, not elsewhere classified: Secondary | ICD-10-CM | POA: Diagnosis not present

## 2022-01-25 DIAGNOSIS — R29898 Other symptoms and signs involving the musculoskeletal system: Secondary | ICD-10-CM

## 2022-01-25 DIAGNOSIS — M25511 Pain in right shoulder: Secondary | ICD-10-CM

## 2022-01-25 DIAGNOSIS — R293 Abnormal posture: Secondary | ICD-10-CM

## 2022-01-25 NOTE — Therapy (Signed)
[facility] [place] [address]  [place], [place], [address] Phone: [phone]   Fax:  [phone]  Physical Therapy Treatment  Patient Details  Name: Meredith Mccormick MRN: [id number] Date of Birth: [birth year] Referring Provider (PT): Justice Britain, MD   Encounter Date: 01/25/2022   PT End of Session - 01/25/22 1448     Visit Number 5    Date for PT Re-Evaluation 03/22/22    Authorization Type UHC UMR    PT Start Time 1448    PT Stop Time 1535    PT Time Calculation (min) 47 min    Activity Tolerance Patient tolerated treatment well    Behavior During Therapy [facility] for tasks assessed/performed             Past Medical History:  Diagnosis Date   Depression    Hypertension     Past Surgical History:  Procedure Laterality Date   ABDOMINAL HYSTERECTOMY     CERVICAL SPINE SURGERY      There were no vitals filed for this visit.   Subjective Assessment - 01/25/22 1450     Subjective Pt reports she has struggled to get comfortable since the last time she was here and feels that her muscles are overly tense but pain has not been as bad.    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 No/denies    Pain Score 0-No pain   up to 6/10 at worst   Pain Location Shoulder    Pain Orientation Right    Pain Descriptors / Indicators Spasm    Pain Type Acute pain;Surgical pain    Pain Frequency Intermittent                               [facility] Adult PT Treatment/Exercise - 01/25/22 1448       Shoulder Exercises: Seated   Retraction Both;10 reps;AROM      Manual Therapy   Manual Therapy Joint mobilization;Soft tissue mobilization;Myofascial release;Scapular mobilization;Passive ROM    Joint Mobilization R shoulder grade I-II distraction and oscillation to promote  shoulder relaxation and reduce guarding during PROM    Soft tissue mobilization STM/DTM to R deltoids, teres group, infraspinatus, biceps and pecs    Myofascial Release manual TPR to R infraspinatus and deltoids    Scapular Mobilization gentle R scap mobilization in all planes    Passive ROM gentle R shoulder PROM all directions to tolerance, with elbow flexed to 90 d/t biceps tenodesis                       PT Short Term Goals - 01/18/22 1456       PT SHORT TERM GOAL #1   Title Patient will be independent with initial HEP    Status Achieved   01/18/22              PT Long Term Goals - 01/04/22 1555       PT LONG TERM GOAL #1   Title Patient will be independent with ongoing/advanced HEP for self-management at home    Status On-going    Target Date 03/22/22      PT LONG TERM GOAL #2   Title Patient to improve R shoulder AROM  to WFL/WNL (flexion 160, abduction 140 and ER 75) without pain provocation    Status On-going    Target Date 03/22/22      PT LONG TERM GOAL #3   Title Patient will demonstrate improved R shoulder strength to >/= 4/5 to 4+/5 for functional UE use    Status On-going    Target Date 03/22/22      PT LONG TERM GOAL #4   Title Patient to demonstrate R shoulder elevation to overhead shelf with 3-5# with good scapular mechanics and no evidence of compensations    Status On-going    Target Date 03/22/22      PT LONG TERM GOAL #5   Title Patient to report ability to perform ADLs, household, and work-related tasks including computer and mouse use without limitation due to R shoulder pain, LOM or weakness    Status On-going    Target Date 03/22/22                   Plan - 01/25/22 1454     Clinical Impression Statement Meredith Mccormick reports increased difficulty relaxing her arm and getting comfortable but reports pain only intermittent. R shoulder very forward rounded from being in the sling which is likely adding to the increased  tension t/o her R shoulder complex. Introduced gentle scapular retraction exercises and scapular mobilization to promote more neutral shoulder alignment to facilitate better muscle relaxation and utilized STM/manual TPR to address increased muscle tension. Some improvement noted but still more guarded during PROM today.    Comorbidities Cervical fusion, HTN, depression, anxiety, BMI > 30, 3 falls in past year    Rehab Potential Good    PT Frequency 1x / week    PT Duration 12 weeks    PT Treatment/Interventions ADLs/Self Care Home Management;Cryotherapy;Electrical Stimulation;Moist Heat;Ultrasound;Functional mobility training;Therapeutic activities;Therapeutic exercise;Neuromuscular re-education;Patient/family education;Manual techniques;Scar mobilization;Passive range of motion;Dry needling;Taping;Vasopneumatic Device;Joint Manipulations    PT Next Visit Plan ROM meaurement for MD PN (visit 02/05/22); gentle L shoulder PROM; initiate glenohumeral and scapulothoracic mobilization; Rehab per RCR + Biceps tenodesis protocol - post-op week #5 as of 01/19/22 (sx 12/22/21); Avoid elbow extension >45 and perform shoulder exercises with elbow at 90 x 6 wks post-op. No isolated biceps exercises until 8 wks post-op.    PT Home Exercise Plan Access Code: [email or web address] (1/12)    Consulted and Agree with Plan of Care Patient             Patient will benefit from skilled therapeutic intervention in order to improve the following deficits and impairments:  Decreased activity tolerance, Decreased knowledge of precautions, Decreased mobility, Decreased range of motion, Decreased scar mobility, Decreased strength, Increased edema, Increased fascial restricitons, Increased muscle spasms, Impaired perceived functional ability, Impaired flexibility, Impaired UE functional use, Improper body mechanics, Postural dysfunction, Pain  Visit Diagnosis: Stiffness of right shoulder, not elsewhere classified  Acute pain of right  shoulder  Muscle weakness (generalized)  Abnormal posture  Other symptoms and signs involving the musculoskeletal system     Problem List Patient Active Problem List   Diagnosis Date Noted   Depression     Percival Spanish, PT 01/25/2022, 4:02 PM  [facility] [place] [address]  [place], [place], [address] Phone: [phone]   Fax:  [phone]  Name: Meredith Mccormick MRN: [id number] Date of Birth: Jan 20, 1956

## 2022-01-30 ENCOUNTER — Other Ambulatory Visit: Payer: Self-pay

## 2022-01-30 ENCOUNTER — Ambulatory Visit: Payer: Commercial Managed Care - PPO

## 2022-01-30 DIAGNOSIS — M25611 Stiffness of right shoulder, not elsewhere classified: Secondary | ICD-10-CM | POA: Diagnosis not present

## 2022-01-30 DIAGNOSIS — M25511 Pain in right shoulder: Secondary | ICD-10-CM

## 2022-01-30 DIAGNOSIS — R293 Abnormal posture: Secondary | ICD-10-CM

## 2022-01-30 DIAGNOSIS — M6281 Muscle weakness (generalized): Secondary | ICD-10-CM

## 2022-01-30 DIAGNOSIS — R29898 Other symptoms and signs involving the musculoskeletal system: Secondary | ICD-10-CM

## 2022-01-30 NOTE — Therapy (Signed)
[facility] [address]  [address] [place], [place], [address] Phone: [phone]   Fax:  [phone]  Physical Therapy Treatment  Patient Details  Name: Meredith Mccormick MRN: [id number] Date of Birth: 23-Dec-1955 Referring Provider (PT): Francena Hanly, MD   Encounter Date: 01/30/2022   PT End of Session - 01/30/22 1529     Visit Number 6    Date for PT Re-Evaluation 03/22/22    Authorization Type UHC UMR    PT Start Time 1448    PT Stop Time 1526    PT Time Calculation (min) 38 min    Activity Tolerance Patient tolerated treatment well    Behavior During Therapy [facility] for tasks assessed/performed             Past Medical History:  Diagnosis Date   Depression    Hypertension     Past Surgical History:  Procedure Laterality Date   ABDOMINAL HYSTERECTOMY     CERVICAL SPINE SURGERY      There were no vitals filed for this visit.   Subjective Assessment - 01/30/22 1450     Subjective Pt reports that her pain has improved, sees her doctor on Tuesday.    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 No/denies                [facility] PT Assessment - 01/30/22 0001       PROM   Right Shoulder Flexion 123 Degrees    Right Shoulder ABduction 112 Degrees    Right Shoulder Internal Rotation 50 Degrees   at 45 deg ABD   Right Shoulder External Rotation 35 Degrees   at 45 deg ABD                          [facility] Adult PT Treatment/Exercise - 01/30/22 0001       Shoulder Exercises: Supine   Other Supine Exercises scap retraction 12x3"      Manual Therapy   Manual Therapy Soft tissue mobilization;Myofascial release;Passive ROM    Soft tissue mobilization STM/DTM to R UT, LS, deltoids, teres group, infraspinatus, biceps    Passive ROM gentle R shoulder PROM all  directions to tolerance, with elbow flexed to 90 d/t biceps tenodesis                       PT Short Term Goals - 01/18/22 1456       PT SHORT TERM GOAL #1   Title Patient will be independent with initial HEP    Status Achieved   01/18/22              PT Long Term Goals - 01/04/22 1555       PT LONG TERM GOAL #1   Title Patient will be independent with ongoing/advanced HEP for self-management at home    Status On-going    Target Date 03/22/22      PT LONG TERM GOAL #2   Title Patient to improve R shoulder AROM to WFL/WNL (flexion 160, abduction 140 and ER 75) without pain provocation    Status On-going    Target Date 03/22/22      PT LONG TERM GOAL #3   Title Patient will demonstrate improved R shoulder  strength to >/= 4/5 to 4+/5 for functional UE use    Status On-going    Target Date 03/22/22      PT LONG TERM GOAL #4   Title Patient to demonstrate R shoulder elevation to overhead shelf with 3-5# with good scapular mechanics and no evidence of compensations    Status On-going    Target Date 03/22/22      PT LONG TERM GOAL #5   Title Patient to report ability to perform ADLs, household, and work-related tasks including computer and mouse use without limitation due to R shoulder pain, LOM or weakness    Status On-going    Target Date 03/22/22                   Plan - 01/30/22 1529     Clinical Impression Statement Pt responded well, at this point she is still in the PROM phase of her protocol. During MT, requires lots of cues to relax as she tends to protract and elevate her scapula during PROM. All motions showed improvement but flexion as this motion causes her to tense in her shoulder the most, which could be limiting the motion. She still is showing some tightness in her periscap muscles which is likely from use of the sling everyday. She still adheres to precuations and I reminded her of them, she declined any modalities.     Comorbidities Cervical fusion, HTN, depression, anxiety, BMI > 30, 3 falls in past year    Rehab Potential --    PT Frequency 1x / week    PT Duration 12 weeks    PT Treatment/Interventions ADLs/Self Care Home Management;Cryotherapy;Electrical Stimulation;Moist Heat;Ultrasound;Functional mobility training;Therapeutic activities;Therapeutic exercise;Neuromuscular re-education;Patient/family education;Manual techniques;Scar mobilization;Passive range of motion;Dry needling;Taping;Vasopneumatic Device;Joint Manipulations    PT Next Visit Plan gentle L shoulder PROM; initiate glenohumeral and scapulothoracic mobilization; Rehab per RCR + Biceps tenodesis protocol - post-op week #5 as of 01/19/22 (sx 12/22/21); Avoid elbow extension >45 and perform shoulder exercises with elbow at 90 x 6 wks post-op. No isolated biceps exercises until 8 wks post-op.    PT Home Exercise Plan Access Code: [email or web address] (1/12)    Consulted and Agree with Plan of Care Patient             Patient will benefit from skilled therapeutic intervention in order to improve the following deficits and impairments:  Decreased activity tolerance, Decreased knowledge of precautions, Decreased mobility, Decreased range of motion, Decreased scar mobility, Decreased strength, Increased edema, Increased fascial restricitons, Increased muscle spasms, Impaired perceived functional ability, Impaired flexibility, Impaired UE functional use, Improper body mechanics, Postural dysfunction, Pain  Visit Diagnosis: Stiffness of right shoulder, not elsewhere classified  Acute pain of right shoulder  Muscle weakness (generalized)  Abnormal posture  Other symptoms and signs involving the musculoskeletal system     Problem List Patient Active Problem List   Diagnosis Date Noted   Depression     Darleene Cleaver, PTA 01/30/2022, 6:37 PM  [facility] [address]  [address] [place], [place], [address] Phone: [phone]   Fax:  [phone]  Name: Meredith Mccormick MRN: [id number] Date of Birth: March 11, 1956

## 2022-02-07 ENCOUNTER — Ambulatory Visit: Payer: Commercial Managed Care - PPO | Admitting: Physical Therapy

## 2022-02-07 ENCOUNTER — Other Ambulatory Visit: Payer: Self-pay

## 2022-02-07 ENCOUNTER — Encounter: Payer: Self-pay | Admitting: Physical Therapy

## 2022-02-07 DIAGNOSIS — R293 Abnormal posture: Secondary | ICD-10-CM

## 2022-02-07 DIAGNOSIS — R29898 Other symptoms and signs involving the musculoskeletal system: Secondary | ICD-10-CM

## 2022-02-07 DIAGNOSIS — M6281 Muscle weakness (generalized): Secondary | ICD-10-CM

## 2022-02-07 DIAGNOSIS — M25611 Stiffness of right shoulder, not elsewhere classified: Secondary | ICD-10-CM

## 2022-02-07 DIAGNOSIS — M25511 Pain in right shoulder: Secondary | ICD-10-CM

## 2022-02-07 NOTE — Therapy (Signed)
[facility] [address]  [address] [place], [place], [address] Phone: [phone]   Fax:  [phone]  Physical Therapy Treatment  Patient Details  Name: Meredith Mccormick MRN: [id number] Date of Birth: July 29, 1956 Referring Provider (PT): Francena Hanly, MD   Encounter Date: 02/07/2022   PT End of Session - 02/07/22 0927     Visit Number 7    Date for PT Re-Evaluation 03/22/22    Authorization Type [employer] UMR    PT Start Time 0927    PT Stop Time 1015    PT Time Calculation (min) 48 min    Activity Tolerance Patient tolerated treatment well;Patient limited by pain    Behavior During Therapy [facility] for tasks assessed/performed             Past Medical History:  Diagnosis Date   Depression    Hypertension     Past Surgical History:  Procedure Laterality Date   ABDOMINAL HYSTERECTOMY     CERVICAL SPINE SURGERY      There were no vitals filed for this visit.   Subjective Assessment - 02/07/22 0930     Subjective Pt reporting she doesn't have to wear the sling anymore. MD wants her to continue 2x/wk. No pain but gets some pain in the biceps intermittlently.    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 No/denies                               [facility] Adult PT Treatment/Exercise - 02/07/22 0001       Elbow Exercises   Elbow Flexion Strengthening;20 reps    Elbow Flexion Limitations HOLD: (did 1# but protocol says wait for isolated biceps until 8 wks/also says okay to do light resistance elbow flexion)      Shoulder Exercises: Supine   External Rotation AAROM;Right;10 reps    External Rotation Limitations cane; VCs for elbow at 90 deg    Flexion AAROM;5 reps    Flexion Limitations with cane; limited by pain      Shoulder Exercises: Seated   Other Seated Exercises  WBing through wrist and BUE (wt shifting side to side and fwd/bwd with wrist in extension)      Shoulder Exercises: Pulleys   Scaption 3 minutes    ABduction Limitations attempted but painful on the way down      Shoulder Exercises: ROM/Strengthening   Other ROM/Strengthening Exercises wall ladder x 5 flexion limited by wrist pain; ball on counter ABC's x 1      Wrist Exercises   Other wrist exercises wrist flexor stretch 2 x 10 sec      Manual Therapy   Manual Therapy Joint mobilization;Passive ROM    Joint Mobilization to right carpals    Passive ROM to R wrist into extension and forearm supination                     PT Education - 02/07/22 1009     Education Details HEP progressed    Person(s) Educated Patient    Methods Explanation;Demonstration;Handout    Comprehension Verbalized understanding;Returned demonstration              PT Short Term Goals - 01/18/22 1456  PT SHORT TERM GOAL #1   Title Patient will be independent with initial HEP    Status Achieved   01/18/22              PT Long Term Goals - 01/04/22 1555       PT LONG TERM GOAL #1   Title Patient will be independent with ongoing/advanced HEP for self-management at home    Status On-going    Target Date 03/22/22      PT LONG TERM GOAL #2   Title Patient to improve R shoulder AROM to WFL/WNL (flexion 160, abduction 140 and ER 75) without pain provocation    Status On-going    Target Date 03/22/22      PT LONG TERM GOAL #3   Title Patient will demonstrate improved R shoulder strength to >/= 4/5 to 4+/5 for functional UE use    Status On-going    Target Date 03/22/22      PT LONG TERM GOAL #4   Title Patient to demonstrate R shoulder elevation to overhead shelf with 3-5# with good scapular mechanics and no evidence of compensations    Status On-going    Target Date 03/22/22      PT LONG TERM GOAL #5   Title Patient to report ability to perform ADLs, household, and  work-related tasks including computer and mouse use without limitation due to R shoulder pain, LOM or weakness    Status On-going    Target Date 03/22/22                   Plan - 02/07/22 1021     Clinical Impression Statement Progressed exercises per protocol today with good response. Some typical increased pain with supine AA flexion. Patient was somewhat limited with TE by reported wrist pain which she thinks may be due to d/c'ing the sling. She had some tenderness at lunate with mobs. Wrist flexor stretch issued in addition to ROM.    Personal Factors and Comorbidities Age;Comorbidity 3+;Fitness;Past/Current Experience;Time since onset of injury/illness/exacerbation    Comorbidities Cervical fusion, HTN, depression, anxiety, BMI > 30, 3 falls in past year    Examination-Activity Limitations Bathing;Bed Mobility;Dressing;Hygiene/Grooming;Lift;Carry;Reach Overhead;Self Feeding;Sleep;Toileting;Transfers    Examination-Participation Restrictions Cleaning;Community Activity;Driving;Laundry;Meal Prep;Occupation;Shop    PT Frequency 1x / week    PT Duration 12 weeks    PT Treatment/Interventions ADLs/Self Care Home Management;Cryotherapy;Electrical Stimulation;Moist Heat;Ultrasound;Functional mobility training;Therapeutic activities;Therapeutic exercise;Neuromuscular re-education;Patient/family education;Manual techniques;Scar mobilization;Passive range of motion;Dry needling;Taping;Vasopneumatic Device;Joint Manipulations    PT Next Visit Plan Rehab per RCR + Biceps tenodesis protocol - post-op week #7 as of 02/09/22 (sx 12/22/21);  No isolated biceps exercises until 8 wks post-op.    PT Home Exercise Plan Access Code: [email or web address] (1/12)    Consulted and Agree with Plan of Care Patient             Patient will benefit from skilled therapeutic intervention in order to improve the following deficits and impairments:  Decreased activity tolerance, Decreased knowledge of precautions, Decreased  mobility, Decreased range of motion, Decreased scar mobility, Decreased strength, Increased edema, Increased fascial restricitons, Increased muscle spasms, Impaired perceived functional ability, Impaired flexibility, Impaired UE functional use, Improper body mechanics, Postural dysfunction, Pain  Visit Diagnosis: Stiffness of right shoulder, not elsewhere classified  Acute pain of right shoulder  Muscle weakness (generalized)  Abnormal posture  Other symptoms and signs involving the musculoskeletal system     Problem List Patient Active Problem List   Diagnosis Date Noted  Depression     Solon Palm, PT 02/07/2022, 10:32 AM  [facility] [address]  [address] [place], [place], [address] Phone: [phone]   Fax:  [phone]  Name: Meredith Mccormick MRN: [id number] Date of Birth: [birth year]

## 2022-02-07 NOTE — Patient Instructions (Addendum)
Access Code: [email or web address] URL: [email or web address] Date: 02/07/2022 Prepared by: Raynelle Fanning  Exercises Flexion-Extension Shoulder Pendulum with Table Support - 3 x daily - 7 x weekly - 2 sets - 10 reps Horizontal Shoulder Pendulum with Table Support - 3 x daily - 7 x weekly - 2 sets - 10 reps Circular Shoulder Pendulum with Table Support - 3 x daily - 7 x weekly - 2 sets - 10 reps Seated Forearm Pronation Supination AROM - 3 x daily - 7 x weekly - 2 sets - 10 reps - 3 sec hold Wrist Flexion AROM - 3 x daily - 7 x weekly - 2 sets - 10 reps - 3 sec hold Wrist Extension AROM - 3 x daily - 7 x weekly - 2 sets - 10 reps - 3 sec hold Towel Roll Squeeze - 3 x daily - 7 x weekly - 2 sets - 10 reps - 3 sec hold Supine Shoulder Flexion with Dowel (Mirrored) - 2 x daily - 7 x weekly - 1-2 sets - 10 reps - 5 sec hold Supine Shoulder External Rotation with Dowel (Mirrored) - 2 x daily - 7 x weekly - 1-2 sets - 10 reps - 5 seconds hold Wrist Flexor Stretch in Pronation - 2 x daily - 7 x weekly - 1 sets - 3 reps - 30 sec hold Supine Elbow Flexion Extension AROM - 1 x daily - 3 x weekly - 2 sets - 10 reps

## 2022-02-08 ENCOUNTER — Ambulatory Visit: Payer: Commercial Managed Care - PPO | Admitting: Physical Therapy

## 2022-02-08 ENCOUNTER — Encounter: Payer: Self-pay | Admitting: Physical Therapy

## 2022-02-08 DIAGNOSIS — R293 Abnormal posture: Secondary | ICD-10-CM

## 2022-02-08 DIAGNOSIS — M25611 Stiffness of right shoulder, not elsewhere classified: Secondary | ICD-10-CM

## 2022-02-08 DIAGNOSIS — M6281 Muscle weakness (generalized): Secondary | ICD-10-CM

## 2022-02-08 DIAGNOSIS — M25511 Pain in right shoulder: Secondary | ICD-10-CM

## 2022-02-08 NOTE — Therapy (Signed)
[facility] [address]  [address] [place], [place], [address] Phone: [phone]   Fax:  [phone]  Physical Therapy Treatment  Patient Details  Name: Meredith Mccormick MRN: [id number] Date of Birth: 1956-05-25 Referring Provider (PT): Francena Hanly, MD   Encounter Date: 02/08/2022   PT End of Session - 02/08/22 1147     Visit Number 8    Date for PT Re-Evaluation 03/22/22    Authorization Type U[employer] UMR    PT Start Time 1147    PT Stop Time 1230    PT Time Calculation (min) 43 min    Activity Tolerance Patient tolerated treatment well;Patient limited by pain    Behavior During Therapy [facility] for tasks assessed/performed             Past Medical History:  Diagnosis Date   Depression    Hypertension     Past Surgical History:  Procedure Laterality Date   ABDOMINAL HYSTERECTOMY     CERVICAL SPINE SURGERY      There were no vitals filed for this visit.   Subjective Assessment - 02/08/22 1147     Subjective I had some trouble with the exercises last night. I slept in my bed for the first time in months and slept well. I wore a wrist spint last night and the wrist feels better today.    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 Yes    Pain Score 1     Pain Location Shoulder    Pain Orientation Right    Pain Descriptors / Indicators Spasm    Pain Type Acute pain;Surgical pain    Pain Onset More than a month ago                               [facility] Adult PT Treatment/Exercise - 02/08/22 0001       Shoulder Exercises: Supine   External Rotation AAROM;Right;20 reps    External Rotation Limitations cane; VCs for elbow at 90 deg    Flexion AAROM;Both;10 reps    Flexion Limitations with cane; limited by fatigue;pain on return to side      Shoulder Exercises:  Seated   Other Seated Exercises Table slides: AAROM using towel flex x 10; PT also demonstrated ABD slide at counter. Pt returned demo x 3.      Shoulder Exercises: Pulleys   Flexion 1 minute    Scaption 3 minutes    ABduction 3 minutes      Wrist Exercises   Other wrist exercises wrist flexor stretch 2 x 20sec                     PT Education - 02/08/22 1229     Education Details HEP modified and progressed; d/c'd pendulums; also discussed importance of holding biceps strengthening until 8 weeks post op.    Person(s) Educated Patient    Methods Explanation;Demonstration;Handout    Comprehension Verbalized understanding;Returned demonstration              PT Short Term Goals - 01/18/22 1456       PT SHORT TERM GOAL #1   Title Patient will be independent with initial HEP    Status Achieved   01/18/22  PT Long Term Goals - 01/04/22 1555       PT LONG TERM GOAL #1   Title Patient will be independent with ongoing/advanced HEP for self-management at home    Status On-going    Target Date 03/22/22      PT LONG TERM GOAL #2   Title Patient to improve R shoulder AROM to WFL/WNL (flexion 160, abduction 140 and ER 75) without pain provocation    Status On-going    Target Date 03/22/22      PT LONG TERM GOAL #3   Title Patient will demonstrate improved R shoulder strength to >/= 4/5 to 4+/5 for functional UE use    Status On-going    Target Date 03/22/22      PT LONG TERM GOAL #4   Title Patient to demonstrate R shoulder elevation to overhead shelf with 3-5# with good scapular mechanics and no evidence of compensations    Status On-going    Target Date 03/22/22      PT LONG TERM GOAL #5   Title Patient to report ability to perform ADLs, household, and work-related tasks including computer and mouse use without limitation due to R shoulder pain, LOM or weakness    Status On-going    Target Date 03/22/22                   Plan  - 02/08/22 1231     Clinical Impression Statement Patient much improved today with pulleys and other AAROM exercises. She had less pain and increased movement in all planes. Still needed some VCs for correct form with supine ER; mainly to keep elbow at 90 degrees.    Personal Factors and Comorbidities Age;Comorbidity 3+;Fitness;Past/Current Experience;Time since onset of injury/illness/exacerbation    Comorbidities Cervical fusion, HTN, depression, anxiety, BMI > 30, 3 falls in past year    Examination-Activity Limitations Bathing;Bed Mobility;Dressing;Hygiene/Grooming;Lift;Carry;Reach Overhead;Self Feeding;Sleep;Toileting;Transfers    PT Frequency 2x / week    PT Duration 12 weeks    PT Treatment/Interventions ADLs/Self Care Home Management;Cryotherapy;Electrical Stimulation;Moist Heat;Ultrasound;Functional mobility training;Therapeutic activities;Therapeutic exercise;Neuromuscular re-education;Patient/family education;Manual techniques;Scar mobilization;Passive range of motion;Dry needling;Taping;Vasopneumatic Device;Joint Manipulations    PT Next Visit Plan Rehab per RCR + Biceps tenodesis protocol - post-op week #7 as of 02/09/22 (sx 12/22/21);  No isolated biceps exercises until 8 wks post-op.    PT Home Exercise Plan Access Code: [email or web address] (1/12)             Patient will benefit from skilled therapeutic intervention in order to improve the following deficits and impairments:  Decreased activity tolerance, Decreased knowledge of precautions, Decreased mobility, Decreased range of motion, Decreased scar mobility, Decreased strength, Increased edema, Increased fascial restricitons, Increased muscle spasms, Impaired perceived functional ability, Impaired flexibility, Impaired UE functional use, Improper body mechanics, Postural dysfunction, Pain  Visit Diagnosis: Stiffness of right shoulder, not elsewhere classified  Acute pain of right shoulder  Muscle weakness (generalized)  Abnormal  posture     Problem List Patient Active Problem List   Diagnosis Date Noted   Depression     Solon Palm PT 02/08/2022, 12:38 PM  [facility] [address]  [address] [place], [place], [address] Phone: [phone]   Fax:  [phone]  Name: Meredith Mccormick MRN: [id number] Date of Birth: [birth year]

## 2022-02-08 NOTE — Patient Instructions (Signed)
Access Code: [email or web address] URL: [email or web address] Date: 02/08/2022 Prepared by: Almyra Free  Exercises Seated Forearm Pronation Supination AROM - 3 x daily - 7 x weekly - 2 sets - 10 reps - 3 sec hold Wrist Flexion AROM - 3 x daily - 7 x weekly - 2 sets - 10 reps - 3 sec hold Wrist Extension AROM - 3 x daily - 7 x weekly - 2 sets - 10 reps - 3 sec hold Towel Roll Squeeze - 3 x daily - 7 x weekly - 2 sets - 10 reps - 3 sec hold Supine Shoulder Flexion with Dowel (Mirrored) - 2 x daily - 7 x weekly - 1-2 sets - 10 reps - 5 sec hold Supine Shoulder External Rotation with Dowel (Mirrored) - 2 x daily - 7 x weekly - 1-2 sets - 10 reps - 5 seconds hold Wrist Flexor Stretch in Pronation - 2 x daily - 7 x weekly - 1 sets - 3 reps - 30 sec hold Supine Elbow Flexion Extension AROM - 1 x daily - 3 x weekly - 2 sets - 10 reps Seated Shoulder Flexion Towel Slide at Table Top - 2 x daily - 7 x weekly - 2 sets - 10 reps - 3-5 sec hold Seated Shoulder Abduction Towel Slide at Table Top - 1 x daily - 7 x weekly - 2 sets - 10 reps

## 2022-02-12 ENCOUNTER — Ambulatory Visit: Payer: Commercial Managed Care - PPO

## 2022-02-12 ENCOUNTER — Other Ambulatory Visit: Payer: Self-pay

## 2022-02-12 DIAGNOSIS — M25611 Stiffness of right shoulder, not elsewhere classified: Secondary | ICD-10-CM | POA: Diagnosis not present

## 2022-02-12 DIAGNOSIS — R29898 Other symptoms and signs involving the musculoskeletal system: Secondary | ICD-10-CM

## 2022-02-12 DIAGNOSIS — R293 Abnormal posture: Secondary | ICD-10-CM

## 2022-02-12 DIAGNOSIS — M25511 Pain in right shoulder: Secondary | ICD-10-CM

## 2022-02-12 DIAGNOSIS — M6281 Muscle weakness (generalized): Secondary | ICD-10-CM

## 2022-02-12 NOTE — Therapy (Signed)
[facility] [address]  [place], [place], [address] Phone: [phone]   Fax:  [phone]  Physical Therapy Treatment  Patient Details  Name: Meredith Mccormick MRN: [id number] Date of Birth: [birth year] Referring Provider (PT): Justice Britain, MD   Encounter Date: 02/12/2022   PT End of Session - 02/12/22 0925     Visit Number 9    Date for PT Re-Evaluation 03/22/22    Authorization Type UHC UMR    PT Start Time 0848    PT Stop Time 0928    PT Time Calculation (min) 40 min    Activity Tolerance Patient tolerated treatment well;Patient limited by pain    Behavior During Therapy [facility] for tasks assessed/performed             Past Medical History:  Diagnosis Date   Depression    Hypertension     Past Surgical History:  Procedure Laterality Date   ABDOMINAL HYSTERECTOMY     CERVICAL SPINE SURGERY      There were no vitals filed for this visit.   Subjective Assessment - 02/12/22 0851     Subjective Pt reports she can only get to 1 set with wand exercises because her shoulder gets easily tired. Having no pain at rest.    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 Yes    Pain Score 2     Pain Location Shoulder    Pain Orientation Right    Pain Type Surgical pain                               [facility] Adult PT Treatment/Exercise - 02/12/22 0001       Shoulder Exercises: Supine   Protraction AAROM;Both;10 reps    Protraction Limitations with cane, cues for proper motion    External Rotation AAROM;Right   only able to tolerate 7 reps   External Rotation Limitations cane; VCs for elbow at 90 deg      Shoulder Exercises: Seated   Other Seated Exercises Table slides: AAROM using towel flex + abd x 10 with 5 sec hold;      Shoulder Exercises: Pulleys    Flexion 3 minutes    Scaption 3 minutes      Manual Therapy   Manual Therapy Soft tissue mobilization    Soft tissue mobilization STM to middle, posterior deltoid, triceps                       PT Short Term Goals - 01/18/22 1456       PT SHORT TERM GOAL #1   Title Patient will be independent with initial HEP    Status Achieved   01/18/22              PT Long Term Goals - 01/04/22 1555       PT LONG TERM GOAL #1   Title Patient will be independent with ongoing/advanced HEP for self-management at home    Status On-going    Target Date 03/22/22      PT LONG TERM GOAL #2   Title Patient to improve R shoulder AROM to WFL/WNL (flexion 160, abduction 140 and ER 75) without pain provocation    Status  On-going    Target Date 03/22/22      PT LONG TERM GOAL #3   Title Patient will demonstrate improved R shoulder strength to >/= 4/5 to 4+/5 for functional UE use    Status On-going    Target Date 03/22/22      PT LONG TERM GOAL #4   Title Patient to demonstrate R shoulder elevation to overhead shelf with 3-5# with good scapular mechanics and no evidence of compensations    Status On-going    Target Date 03/22/22      PT LONG TERM GOAL #5   Title Patient to report ability to perform ADLs, household, and work-related tasks including computer and mouse use without limitation due to R shoulder pain, LOM or weakness    Status On-going    Target Date 03/22/22                   Plan - 02/12/22 0931     Clinical Impression Statement Pt had reports of mild pain with pulleys today. Continued with A/AAROM exercises within protocol. Cues were needed with all movements to avoid painful ROM. She did require extra instruction with the supine ER with cane to keep elbow at 90 deg. Reminded her of protocol restriction of elbow flexion until week 8. Pt overall responded well to the treatment.    Personal Factors and Comorbidities Age;Comorbidity  3+;Fitness;Past/Current Experience;Time since onset of injury/illness/exacerbation    Comorbidities Cervical fusion, HTN, depression, anxiety, BMI > 30, 3 falls in past year    PT Frequency 2x / week    PT Duration 12 weeks    PT Treatment/Interventions ADLs/Self Care Home Management;Cryotherapy;Electrical Stimulation;Moist Heat;Ultrasound;Functional mobility training;Therapeutic activities;Therapeutic exercise;Neuromuscular re-education;Patient/family education;Manual techniques;Scar mobilization;Passive range of motion;Dry needling;Taping;Vasopneumatic Device;Joint Manipulations    PT Next Visit Plan Rehab per RCR + Biceps tenodesis protocol - post-op week #7 as of 02/09/22 (sx 12/22/21);  No isolated biceps exercises until 8 wks post-op.    PT Home Exercise Plan Access Code: [email or web address] (1/12)    Consulted and Agree with Plan of Care Patient             Patient will benefit from skilled therapeutic intervention in order to improve the following deficits and impairments:  Decreased activity tolerance, Decreased knowledge of precautions, Decreased mobility, Decreased range of motion, Decreased scar mobility, Decreased strength, Increased edema, Increased fascial restricitons, Increased muscle spasms, Impaired perceived functional ability, Impaired flexibility, Impaired UE functional use, Improper body mechanics, Postural dysfunction, Pain  Visit Diagnosis: Stiffness of right shoulder, not elsewhere classified  Acute pain of right shoulder  Muscle weakness (generalized)  Abnormal posture  Other symptoms and signs involving the musculoskeletal system     Problem List Patient Active Problem List   Diagnosis Date Noted   Depression     Artist Pais, PTA 02/12/2022, 9:48 AM  [facility] [address]  [place], [place], [address] Phone: [phone]   Fax:  [phone]  Name: Meredith Mccormick MRN: [id number] Date of  Birth: 26-Jan-1956

## 2022-02-12 NOTE — Therapy (Deleted)
[facility] Rehabilitation [facility] [address]  [address] [place], [place], [address] Phone: [phone]   Fax:  [phone]  Physical Therapy Treatment  Patient Details  Name: Meredith Mccormick MRN: [id number] Date of Birth: 1956/09/10 Referring Provider (PT): Francena Hanly, MD   Encounter Date: 02/12/2022   PT End of Session - 02/12/22 0925     Visit Number 9    Date for PT Re-Evaluation 03/22/22    Authorization Type UHC UMR    PT Start Time 0848    PT Stop Time 0928    PT Time Calculation (min) 40 min    Activity Tolerance Patient tolerated treatment well;Patient limited by pain    Behavior During Therapy [facility] for tasks assessed/performed             Past Medical History:  Diagnosis Date   Depression    Hypertension     Past Surgical History:  Procedure Laterality Date   ABDOMINAL HYSTERECTOMY     CERVICAL SPINE SURGERY      There were no vitals filed for this visit.   Subjective Assessment - 02/12/22 0851     Subjective Pt reports she can only get to 1 set with wand exercises because her shoulder gets easily tired. Having no pain at rest.    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 Yes    Pain Score 2     Pain Location Shoulder    Pain Orientation Right    Pain Type Surgical pain                               [facility] Adult PT Treatment/Exercise - 02/12/22 0001       Shoulder Exercises: Supine   Protraction AAROM;Both;10 reps    Protraction Limitations with cane, cues for proper motion    External Rotation AAROM;Right   only able to tolerate 7 reps   External Rotation Limitations cane; VCs for elbow at 90 deg      Shoulder Exercises: Seated   Other Seated Exercises Table slides: AAROM using towel flex + abd x 10 with 5 sec hold;      Shoulder Exercises: Pulleys    Flexion 3 minutes    Scaption 3 minutes                       PT Short Term Goals - 01/18/22 1456       PT SHORT TERM GOAL #1   Title Patient will be independent with initial HEP    Status Achieved   01/18/22              PT Long Term Goals - 01/04/22 1555       PT LONG TERM GOAL #1   Title Patient will be independent with ongoing/advanced HEP for self-management at home    Status On-going    Target Date 03/22/22      PT LONG TERM GOAL #2   Title Patient to improve R shoulder AROM to WFL/WNL (flexion 160, abduction 140 and ER 75) without pain provocation    Status On-going    Target Date 03/22/22      PT LONG TERM GOAL #3   Title Patient will demonstrate improved R shoulder  strength to >/= 4/5 to 4+/5 for functional UE use    Status On-going    Target Date 03/22/22      PT LONG TERM GOAL #4   Title Patient to demonstrate R shoulder elevation to overhead shelf with 3-5# with good scapular mechanics and no evidence of compensations    Status On-going    Target Date 03/22/22      PT LONG TERM GOAL #5   Title Patient to report ability to perform ADLs, household, and work-related tasks including computer and mouse use without limitation due to R shoulder pain, LOM or weakness    Status On-going    Target Date 03/22/22                   Plan - 02/12/22 0931     Clinical Impression Statement Pt had reports of mild pain with pulleys today. Continued with A/AAROM exercises within protocol. Cues were needed with all movements to avoid painful ROM. She did require extra instruction with the supine ER with cane to keep elbow at 90 deg. Reminded her of protocol restriction of elbow flexion until week 8. Pt overall responded well to the treatment.    Personal Factors and Comorbidities Age;Comorbidity 3+;Fitness;Past/Current Experience;Time since onset of injury/illness/exacerbation    Comorbidities Cervical fusion, HTN, depression, anxiety, BMI > 30, 3  falls in past year    PT Frequency 2x / week    PT Duration 12 weeks    PT Treatment/Interventions ADLs/Self Care Home Management;Cryotherapy;Electrical Stimulation;Moist Heat;Ultrasound;Functional mobility training;Therapeutic activities;Therapeutic exercise;Neuromuscular re-education;Patient/family education;Manual techniques;Scar mobilization;Passive range of motion;Dry needling;Taping;Vasopneumatic Device;Joint Manipulations    PT Next Visit Plan Rehab per RCR + Biceps tenodesis protocol - post-op week #7 as of 02/09/22 (sx 12/22/21);  No isolated biceps exercises until 8 wks post-op.    PT Home Exercise Plan Access Code: [email or web address] (1/12)    Consulted and Agree with Plan of Care Patient             Patient will benefit from skilled therapeutic intervention in order to improve the following deficits and impairments:  Decreased activity tolerance, Decreased knowledge of precautions, Decreased mobility, Decreased range of motion, Decreased scar mobility, Decreased strength, Increased edema, Increased fascial restricitons, Increased muscle spasms, Impaired perceived functional ability, Impaired flexibility, Impaired UE functional use, Improper body mechanics, Postural dysfunction, Pain  Visit Diagnosis: Stiffness of right shoulder, not elsewhere classified  Acute pain of right shoulder  Muscle weakness (generalized)  Abnormal posture  Other symptoms and signs involving the musculoskeletal system     Problem List Patient Active Problem List   Diagnosis Date Noted   Depression     Darleene Cleaver, PTA 02/12/2022, 9:37 AM  [facility] [address]  [address] [place], [place], [address] Phone: [phone]   Fax:  [phone]  Name: Meredith Mccormick MRN: [id number] Date of Birth: [birth year]

## 2022-02-15 ENCOUNTER — Ambulatory Visit: Payer: Commercial Managed Care - PPO | Attending: Orthopedic Surgery | Admitting: Physical Therapy

## 2022-02-15 ENCOUNTER — Encounter: Payer: Self-pay | Admitting: Physical Therapy

## 2022-02-15 ENCOUNTER — Other Ambulatory Visit: Payer: Self-pay

## 2022-02-15 DIAGNOSIS — M6281 Muscle weakness (generalized): Secondary | ICD-10-CM | POA: Diagnosis present

## 2022-02-15 DIAGNOSIS — M25511 Pain in right shoulder: Secondary | ICD-10-CM | POA: Insufficient documentation

## 2022-02-15 DIAGNOSIS — R29898 Other symptoms and signs involving the musculoskeletal system: Secondary | ICD-10-CM | POA: Insufficient documentation

## 2022-02-15 DIAGNOSIS — M25611 Stiffness of right shoulder, not elsewhere classified: Secondary | ICD-10-CM | POA: Diagnosis not present

## 2022-02-15 DIAGNOSIS — R293 Abnormal posture: Secondary | ICD-10-CM | POA: Diagnosis present

## 2022-02-15 NOTE — Patient Instructions (Addendum)
? ?  Access Code: [email or web address] ?URL: [email or web address] ?Date: 02/15/2022 ?Prepared by: Annie Paras ? ?Exercises ?Seated Forearm Pronation Supination AROM - 3 x daily - 7 x weekly - 2 sets - 10 reps - 3 sec hold ?Wrist Flexion AROM - 3 x daily - 7 x weekly - 2 sets - 10 reps - 3 sec hold ?Wrist Extension AROM - 3 x daily - 7 x weekly - 2 sets - 10 reps - 3 sec hold ?Towel Roll Squeeze - 3 x daily - 7 x weekly - 2 sets - 10 reps - 3 sec hold ?Supine Shoulder Flexion with Dowel (Mirrored) - 2 x daily - 7 x weekly - 1-2 sets - 10 reps - 5 sec hold ?Supine Shoulder External Rotation with Dowel (Mirrored) - 2 x daily - 7 x weekly - 1-2 sets - 10 reps - 5 seconds hold ?Wrist Flexor Stretch in Pronation - 2 x daily - 7 x weekly - 1 sets - 3 reps - 30 sec hold ?Supine Elbow Flexion Extension AROM - 1 x daily - 3 x weekly - 2 sets - 10 reps ?Seated Shoulder Flexion Towel Slide at Table Top - 2 x daily - 7 x weekly - 2 sets - 10 reps - 3-5 sec hold ?Seated Shoulder Abduction Towel Slide at Table Top - 1 x daily - 7 x weekly - 2 sets - 10 reps ?Seated Single Arm Bent Over Shoulder Row with Dumbbell - 1 x daily - 7 x weekly - 2 sets - 10 reps - 3 sec hold ?Single Arm Bent Over Shoulder Extension with Dumbbell - 1 x daily - 7 x weekly - 2 sets - 10 reps - 3 sec hold ? ?

## 2022-02-15 NOTE — Therapy (Signed)
[place] ?[facility] ?[address] ?[place], [place], [address] ?Phone: [phone]   Fax:  [phone] ? ?Physical Therapy Treatment ? ?Patient Details  ?Name: Meredith Mccormick ?MRN: [id number] ?Date of Birth: 1956/03/31 ?Referring Provider (PT): Justice Britain, MD ? ? ?Encounter Date: 02/15/2022 ? ? PT End of Session - 02/15/22 1446   ? ? Visit Number 10   ? Date for PT Re-Evaluation 03/22/22   ? Authorization Type UHC UMR   ? PT Start Time 1884   ? PT Stop Time 1536   ? PT Time Calculation (min) 50 min   ? Activity Tolerance Patient tolerated treatment well;Patient limited by pain   ? Behavior During Therapy [facility] for tasks assessed/performed   ? ?  ?  ? ?  ? ? ?Past Medical History:  ?Diagnosis Date  ? Depression   ? Hypertension   ? ? ?Past Surgical History:  ?Procedure Laterality Date  ? ABDOMINAL HYSTERECTOMY    ? CERVICAL SPINE SURGERY    ? ? ?There were no vitals filed for this visit. ? ? Subjective Assessment - 02/15/22 1449   ? ? Subjective Pt reports she must have slept wrong last night because her R biceps is speaking up to today.   ? Pertinent History R RTC repair & biceps tenodesis 12/22/21   ? 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 Currently in Pain? Yes   ? Pain Score 2    ? Pain Location Shoulder   ? Pain Orientation Right   ? Pain Descriptors / Indicators Spasm   ? Pain Type Surgical pain   ? Pain Frequency Intermittent   ? ?  ?  ? ?  ? ? ? ? ? [facility] PT Assessment - 02/15/22 1446   ? ?  ? Assessment  ? Medical Diagnosis R RCR + bicpes tenodesis   ? Referring Provider (PT) Justice Britain, MD   ? Onset Date/Surgical Date 12/22/21   ? Next MD Visit 03/05/22   ? ?  ?  ? ?  ? ? ? ? ? ? ? ? ? ? ? ? ? ? ? ? [place] Adult PT Treatment/Exercise - 02/15/22 1446   ? ?  ? Shoulder Exercises: Supine  ? Protraction Both;AAROM;Right;10 reps   ? Protraction Limitations with cane, cues for proper motion   ? External  Rotation Right;AAROM;10 reps   ? External Rotation Limitations cane with towel roll under upper arm; VCs for elbow at 90 deg   ? Flexion Both;AAROM;Right;10 reps   ? Flexion Limitations cane - pt reporting popping sensation with home performance but not detected during treatment today   ?  ? Shoulder Exercises: Seated  ? Retraction Both;10 reps;AROM   ? Retraction Limitations retraction + depression + slight shoulder ER   ?  ? Shoulder Exercises: Prone  ? Retraction Right;10 reps;AROM   3 sets  ? Retraction Limitations Row - seated with trunk flexed and upper body supported on L arm in lap - cues for scap retraction avoiding actively flexing biceps   pt stuggling with coordination of the motion  ? Extension Right;AROM;10 reps   ? Extension Limitations seated with trunk flexed and upper body supported on L arm in lap - cues for scap retraction   ?  ? Shoulder Exercises: Pulleys  ? Flexion 3 minutes   ? Scaption 3 minutes   ?  ?  Manual Therapy  ? Manual Therapy Soft tissue mobilization   ? Soft tissue mobilization STM to R biceps   ? ?  ?  ? ?  ? ? ? ? ? ? ? ? ? ? ? ? PT Short Term Goals - 01/18/22 1456   ? ?  ? PT SHORT TERM GOAL #1  ? Title Patient will be independent with initial HEP   ? Status Achieved   01/18/22  ? ?  ?  ? ?  ? ? ? ? PT Long Term Goals - 02/15/22 1536   ? ?  ? PT LONG TERM GOAL #1  ? Title Patient will be independent with ongoing/advanced HEP for self-management at home   ? Status Partially Met   ? Target Date 03/22/22   ?  ? PT LONG TERM GOAL #2  ? Title Patient to improve R shoulder AROM to WFL/WNL (flexion 160?, abduction 140? and ER 75?) without pain provocation   ? Status On-going   ? Target Date 03/22/22   ?  ? PT LONG TERM GOAL #3  ? Title Patient will demonstrate improved R shoulder strength to >/= 4/5 to 4+/5 for functional UE use   ? Status On-going   ? Target Date 03/22/22   ?  ? PT LONG TERM GOAL #4  ? Title Patient to demonstrate R shoulder elevation to overhead shelf with 3-5# with  good scapular mechanics and no evidence of compensations   ? Status On-going   ? Target Date 03/22/22   ?  ? PT LONG TERM GOAL #5  ? Title Patient to report ability to perform ADLs, household, and work-related tasks including computer and mouse use without limitation due to R shoulder pain, LOM or weakness   ? Status On-going   ? Target Date 03/22/22   ? ?  ?  ? ?  ? ? ? ? ? ? ? ? Plan - 02/15/22 1536   ? ? Clinical Impression Statement Haylynn reports increased pain in her biceps today which she attributes to how she sleep last night. She also notes popping in her shoulder when she does the wand flexion but upon attempt during PT today, no popping occurred. Further review of wand ER completed as pt still not confident of movement pattern. Introduced prone scapular retraction but pt uncomfortable with idea of laying prone, therefore performed in seated forward trunk flexion (bent over row & extension) - cues necessary for scapular activation and pt struggling with movement pattern for row so as not to activate biceps - she will benefit from further review next visit. Pt reporting pain resolved by end of visit and declined any modalities.   ? Comorbidities Cervical fusion, HTN, depression, anxiety, BMI > 30, 3 falls in past year   ? Rehab Potential Good   ? PT Frequency 2x / week   ? PT Duration 12 weeks   ? PT Treatment/Interventions ADLs/Self Care Home Management;Cryotherapy;Electrical Stimulation;Moist Heat;Ultrasound;Functional mobility training;Therapeutic activities;Therapeutic exercise;Neuromuscular re-education;Patient/family education;Manual techniques;Scar mobilization;Passive range of motion;Dry needling;Taping;Vasopneumatic Device;Joint Manipulations   ? PT Next Visit Plan Rehab per RCR + Biceps tenodesis protocol - post-op week #8 as of 02/16/22 (sx 12/22/21) - initiate phase 3   ? PT Home Exercise Plan Access Code: [email or web address]   ? Consulted and Agree with Plan of Care Patient   ? ?  ?  ? ?  ? ? ?Patient will  benefit from skilled therapeutic intervention in order to improve the following deficits and impairments:  Decreased  activity tolerance, Decreased knowledge of precautions, Decreased mobility, Decreased range of motion, Decreased scar mobility, Decreased strength, Increased edema, Increased fascial restricitons, Increased muscle spasms, Impaired perceived functional ability, Impaired flexibility, Impaired UE functional use, Improper body mechanics, Postural dysfunction, Pain ? ?Visit Diagnosis: ?Stiffness of right shoulder, not elsewhere classified ? ?Acute pain of right shoulder ? ?Muscle weakness (generalized) ? ?Abnormal posture ? ?Other symptoms and signs involving the musculoskeletal system ? ? ? ? ?Problem List ?Patient Active Problem List  ? Diagnosis Date Noted  ? Depression   ? ? ?Percival Spanish, PT ?02/15/2022, 4:01 PM ? ?[place] ?[facility] ?[address] ?[place], [place], [address] ?Phone: [phone]   Fax:  [phone] ? ?Name: ALLIEN MELBERG ?MRN: [id number] ?Date of Birth: 08/24/1956 ? ? ? ?

## 2022-02-20 ENCOUNTER — Ambulatory Visit: Payer: Commercial Managed Care - PPO

## 2022-02-20 ENCOUNTER — Other Ambulatory Visit: Payer: Self-pay

## 2022-02-20 DIAGNOSIS — M25511 Pain in right shoulder: Secondary | ICD-10-CM

## 2022-02-20 DIAGNOSIS — R29898 Other symptoms and signs involving the musculoskeletal system: Secondary | ICD-10-CM

## 2022-02-20 DIAGNOSIS — M25611 Stiffness of right shoulder, not elsewhere classified: Secondary | ICD-10-CM

## 2022-02-20 DIAGNOSIS — R293 Abnormal posture: Secondary | ICD-10-CM

## 2022-02-20 DIAGNOSIS — M6281 Muscle weakness (generalized): Secondary | ICD-10-CM

## 2022-02-20 NOTE — Therapy (Signed)
[place] ?[facility] ?[address] ?[place], [place], [address] ?Phone: [phone]   Fax:  [phone] ? ?Physical Therapy Treatment ? ?Patient Details  ?Name: Meredith Mccormick ?MRN: [id number] ?Date of Birth: 21-Dec-1955 ?Referring Provider (PT): Justice Britain, MD ? ? ?Encounter Date: 02/20/2022 ? ? PT End of Session - 02/20/22 1537   ? ? Visit Number 11   ? Date for PT Re-Evaluation 03/22/22   ? Authorization Type UHC UMR   ? PT Start Time 6962   ? PT Stop Time 1529   ? PT Time Calculation (min) 42 min   ? Activity Tolerance Patient tolerated treatment well   ? Behavior During Therapy [facility] for tasks assessed/performed   ? ?  ?  ? ?  ? ? ?Past Medical History:  ?Diagnosis Date  ? Depression   ? Hypertension   ? ? ?Past Surgical History:  ?Procedure Laterality Date  ? ABDOMINAL HYSTERECTOMY    ? CERVICAL SPINE SURGERY    ? ? ?There were no vitals filed for this visit. ? ? Subjective Assessment - 02/20/22 1449   ? ? Subjective The shoulder is doing well, every now and then it twinges.   ? Pertinent History R RTC repair & biceps tenodesis 12/22/21   ? 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 Currently in Pain? No/denies   ? ?  ?  ? ?  ? ? ? ? ? ? ? ? ? ? ? ? ? ? ? ? ? ? ? ? [place] Adult PT Treatment/Exercise - 02/20/22 0001   ? ?  ? Shoulder Exercises: Seated  ? Extension Right;10 reps;Weights   ? Extension Weight (lbs) 1   ? Row Right;10 reps;Weights   ? Row Weight (lbs) 1   ? Row Limitations bent over row   ?  ? Shoulder Exercises: Sidelying  ? External Rotation AROM;Right;10 reps   ? ABduction AROM;Right;10 reps   ?  ? Shoulder Exercises: Pulleys  ? Flexion 3 minutes   ? Scaption 3 minutes   ?  ? Shoulder Exercises: ROM/Strengthening  ? Other ROM/Strengthening Exercises 7x R cabinet reaches   ? Other ROM/Strengthening Exercises finger ladder x 5 with slow eccentric lowering (therapist assist)   ?  ? Manual  Therapy  ? Manual Therapy Joint mobilization;Passive ROM   ? Joint Mobilization shoulder inf/post glides grade II-III   ? Passive ROM R shoulder flex and abd   ? ?  ?  ? ?  ? ? ? ? ? ? ? ? ? ? ? ? PT Short Term Goals - 01/18/22 1456   ? ?  ? PT SHORT TERM GOAL #1  ? Title Patient will be independent with initial HEP   ? Status Achieved   01/18/22  ? ?  ?  ? ?  ? ? ? ? PT Long Term Goals - 02/15/22 1536   ? ?  ? PT LONG TERM GOAL #1  ? Title Patient will be independent with ongoing/advanced HEP for self-management at home   ? Status Partially Met   ? Target Date 03/22/22   ?  ? PT LONG TERM GOAL #2  ? Title Patient to improve R shoulder AROM to WFL/WNL (flexion 160?, abduction 140? and ER 75?) without pain provocation   ? Status On-going   ? Target Date 03/22/22   ?  ? PT LONG TERM  GOAL #3  ? Title Patient will demonstrate improved R shoulder strength to >/= 4/5 to 4+/5 for functional UE use   ? Status On-going   ? Target Date 03/22/22   ?  ? PT LONG TERM GOAL #4  ? Title Patient to demonstrate R shoulder elevation to overhead shelf with 3-5# with good scapular mechanics and no evidence of compensations   ? Status On-going   ? Target Date 03/22/22   ?  ? PT LONG TERM GOAL #5  ? Title Patient to report ability to perform ADLs, household, and work-related tasks including computer and mouse use without limitation due to R shoulder pain, LOM or weakness   ? Status On-going   ? Target Date 03/22/22   ? ?  ?  ? ?  ? ? ? ? ? ? ? ? Plan - 02/20/22 1538   ? ? Clinical Impression Statement Pt was limited by fatigue today with the R shoulder ROM in standing. Needed assistance with finger ladder for eccentric control. Progressed seated row and extension to small weight with no problems. Good response to MT. Pt would need more work on functional ROM of the R shoulder.   ? Personal Factors and Comorbidities Age;Comorbidity 3+;Fitness;Past/Current Experience;Time since onset of injury/illness/exacerbation   ? Comorbidities Cervical  fusion, HTN, depression, anxiety, BMI > 30, 3 falls in past year   ? PT Frequency 2x / week   ? PT Duration 12 weeks   ? PT Treatment/Interventions ADLs/Self Care Home Management;Cryotherapy;Electrical Stimulation;Moist Heat;Ultrasound;Functional mobility training;Therapeutic activities;Therapeutic exercise;Neuromuscular re-education;Patient/family education;Manual techniques;Scar mobilization;Passive range of motion;Dry needling;Taping;Vasopneumatic Device;Joint Manipulations   ? PT Next Visit Plan Rehab per RCR + Biceps tenodesis protocol - post-op week #8 as of 02/16/22 (sx 12/22/21) - initiate phase 3   ? PT Home Exercise Plan Access Code: [email or web address]   ? Consulted and Agree with Plan of Care Patient   ? ?  ?  ? ?  ? ? ?Patient will benefit from skilled therapeutic intervention in order to improve the following deficits and impairments:  Decreased activity tolerance, Decreased knowledge of precautions, Decreased mobility, Decreased range of motion, Decreased scar mobility, Decreased strength, Increased edema, Increased fascial restricitons, Increased muscle spasms, Impaired perceived functional ability, Impaired flexibility, Impaired UE functional use, Improper body mechanics, Postural dysfunction, Pain ? ?Visit Diagnosis: ?Stiffness of right shoulder, not elsewhere classified ? ?Acute pain of right shoulder ? ?Muscle weakness (generalized) ? ?Abnormal posture ? ?Other symptoms and signs involving the musculoskeletal system ? ? ? ? ?Problem List ?Patient Active Problem List  ? Diagnosis Date Noted  ? Depression   ? ? ?Artist Pais, PTA ?02/20/2022, 3:40 PM ? ?[place] ?[facility] ?[place] ?[place], [place], [address] ?Phone: [phone]   Fax:  [phone] ? ?Name: Meredith Mccormick ?MRN: [id number] ?Date of Birth: 01-12-1956 ? ? ? ?

## 2022-02-22 ENCOUNTER — Ambulatory Visit: Payer: Commercial Managed Care - PPO | Admitting: Physical Therapy

## 2022-02-22 ENCOUNTER — Encounter: Payer: Self-pay | Admitting: Physical Therapy

## 2022-02-22 ENCOUNTER — Other Ambulatory Visit: Payer: Self-pay

## 2022-02-22 DIAGNOSIS — R293 Abnormal posture: Secondary | ICD-10-CM

## 2022-02-22 DIAGNOSIS — M25611 Stiffness of right shoulder, not elsewhere classified: Secondary | ICD-10-CM | POA: Diagnosis not present

## 2022-02-22 DIAGNOSIS — R29898 Other symptoms and signs involving the musculoskeletal system: Secondary | ICD-10-CM

## 2022-02-22 DIAGNOSIS — M25511 Pain in right shoulder: Secondary | ICD-10-CM

## 2022-02-22 DIAGNOSIS — M6281 Muscle weakness (generalized): Secondary | ICD-10-CM

## 2022-02-22 NOTE — Patient Instructions (Signed)
Access Code: [email or web address] ?URL: [email or web address] ?Date: 02/22/2022 ?Prepared by: Raynelle Fanning ? ?Exercises ?Seated Forearm Pronation Supination AROM - 3 x daily - 7 x weekly - 2 sets - 10 reps - 3 sec hold ?Wrist Flexion AROM - 3 x daily - 7 x weekly - 2 sets - 10 reps - 3 sec hold ?Wrist Extension AROM - 3 x daily - 7 x weekly - 2 sets - 10 reps - 3 sec hold ?Towel Roll Squeeze - 3 x daily - 7 x weekly - 2 sets - 10 reps - 3 sec hold ?Supine Shoulder Flexion with Dowel (Mirrored) - 2 x daily - 7 x weekly - 1-2 sets - 10 reps - 5 sec hold ?Supine Shoulder External Rotation with Dowel (Mirrored) - 2 x daily - 7 x weekly - 1-2 sets - 10 reps - 5 seconds hold ?Wrist Flexor Stretch in Pronation - 2 x daily - 7 x weekly - 1 sets - 3 reps - 30 sec hold ?Supine Elbow Flexion Extension AROM - 1 x daily - 3 x weekly - 2 sets - 10 reps ?Seated Shoulder Flexion Towel Slide at Table Top - 2 x daily - 7 x weekly - 2 sets - 10 reps - 3-5 sec hold ?Seated Shoulder Abduction Towel Slide at Table Top - 1 x daily - 7 x weekly - 2 sets - 10 reps ?Seated Single Arm Bent Over Shoulder Row with Dumbbell - 1 x daily - 3 x weekly - 2 sets - 10 reps - 3 sec hold ?Single Arm Bent Over Shoulder Extension with Dumbbell - 1 x daily - 3 x weekly - 2 sets - 10 reps - 3 sec hold ?Supine Shoulder Abduction AROM (Mirrored) - 1 x daily - 3-4 x weekly - 1-3 sets - 10 reps ?Sidelying Shoulder Flexion 15 Degrees (Mirrored) - 1 x daily - 3-4 x weekly - 1-3 sets - 10 reps ?Shoulder Flexion Wall Slide with Towel - 1 x daily - 7 x weekly - 1-3 sets - 10 reps ?Seated Cervical Sidebending Stretch - 2 x daily - 7 x weekly - 1 sets - 3 reps - 30-60 sec hold ? ?

## 2022-02-22 NOTE — Therapy (Signed)
[facility] ?[facility] ?[address] ?[place], [place], [address] ?Phone: [phone]   Fax:  [phone] ? ?Physical Therapy Treatment ? ?Patient Details  ?Name: Meredith Mccormick ?MRN: [id number] ?Date of Birth: 11-15-1956 ?Referring Provider (PT): Justice Britain, MD ? ? ?Encounter Date: 02/22/2022 ? ? PT End of Session - 02/22/22 0849   ? ? Visit Number 12   ? Date for PT Re-Evaluation 03/22/22   ? Authorization Type UHC UMR   ? PT Start Time 0800   ? PT Stop Time 6294   ? PT Time Calculation (min) 49 min   ? Activity Tolerance Patient tolerated treatment well   ? Behavior During Therapy [facility] for tasks assessed/performed   ? ?  ?  ? ?  ? ? ?Past Medical History:  ?Diagnosis Date  ? Depression   ? Hypertension   ? ? ?Past Surgical History:  ?Procedure Laterality Date  ? ABDOMINAL HYSTERECTOMY    ? CERVICAL SPINE SURGERY    ? ? ?There were no vitals filed for this visit. ? ? Subjective Assessment - 02/22/22 0759   ? ? Subjective My right neck is hurting today because I think I slept on it wrong. Having to use my left arm to help with some of the new exercises.   ? Pertinent History R RTC repair & biceps tenodesis 12/22/21   ? 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 Currently in Pain? No/denies   ? ?  ?  ? ?  ? ? ? ? ? ? ? ? ? ? ? ? ? ? ? ? ? ? ? ? [facility] Adult PT Treatment/Exercise - 02/22/22 0001   ? ?  ? Elbow Exercises  ? Elbow Flexion 20 reps;Supine   ? Elbow Flexion Limitations 1st set 1#; 2nd 2#   ? Elbow Extension Right;10 reps;Seated   ? Bar Weights/Barbell (Elbow Extension) 1 lb   ?  ? Shoulder Exercises: Supine  ? Flexion AAROM;10 reps   ? Flexion Limitations with hands clasped together   ? ABduction AROM;10 reps   ? ABduction Limitations with hand on washcloth; 2x5   ?  ? Shoulder Exercises: Seated  ? Extension Right;10 reps;Weights   ? Extension Weight (lbs) 2   ? Row Right;10 reps;Weights   ? Row  Weight (lbs) 2   ? Row Limitations bent over row   ?  ? Shoulder Exercises: Sidelying  ? External Rotation AROM;Right;10 reps   ? Flexion AAROM;10 reps   ? ABduction AROM;Right;10 reps   ? ABduction Limitations elbow at 90 deg   ?  ? Shoulder Exercises: Pulleys  ? Flexion 3 minutes   ? Scaption 3 minutes   ?  ? Shoulder Exercises: ROM/Strengthening  ? Wall Wash flex in available range x 10   ?  ? Shoulder Exercises: Stretch  ? Other Shoulder Stretches UT stretch seated 2x 30 sec Rt, Lt 1 x 30 sec   ?  ? Manual Therapy  ? Manual Therapy Soft tissue mobilization;Passive ROM   ? Soft tissue mobilization to right UT and LS   ? Passive ROM passive stretch to R UT/LS x 30 sec each   ? ?  ?  ? ?  ? ? ? ? ? ? ? ? ? ? PT Education - 02/22/22 1256   ? ? Education Details HEP progressed   ? Person(s) Educated Patient   ?  Methods Explanation;Demonstration;Handout   ? Comprehension Verbalized understanding;Returned demonstration   ? ?  ?  ? ?  ? ? ? PT Short Term Goals - 01/18/22 1456   ? ?  ? PT SHORT TERM GOAL #1  ? Title Patient will be independent with initial HEP   ? Status Achieved   01/18/22  ? ?  ?  ? ?  ? ? ? ? PT Long Term Goals - 02/15/22 1536   ? ?  ? PT LONG TERM GOAL #1  ? Title Patient will be independent with ongoing/advanced HEP for self-management at home   ? Status Partially Met   ? Target Date 03/22/22   ?  ? PT LONG TERM GOAL #2  ? Title Patient to improve R shoulder AROM to WFL/WNL (flexion 160?, abduction 140? and ER 75?) without pain provocation   ? Status On-going   ? Target Date 03/22/22   ?  ? PT LONG TERM GOAL #3  ? Title Patient will demonstrate improved R shoulder strength to >/= 4/5 to 4+/5 for functional UE use   ? Status On-going   ? Target Date 03/22/22   ?  ? PT LONG TERM GOAL #4  ? Title Patient to demonstrate R shoulder elevation to overhead shelf with 3-5# with good scapular mechanics and no evidence of compensations   ? Status On-going   ? Target Date 03/22/22   ?  ? PT LONG TERM GOAL #5  ?  Title Patient to report ability to perform ADLs, household, and work-related tasks including computer and mouse use without limitation due to R shoulder pain, LOM or weakness   ? Status On-going   ? Target Date 03/22/22   ? ?  ?  ? ?  ? ? ? ? ? ? ? ? Plan - 02/22/22 1248   ? ? Clinical Impression Statement Pt reporting increased right neck pain today. Reyes is still having significant weakness with flexion and ABD so we modified TE to eliminate gravity for these motions. She was able to do a wall wash in flexion in a small range, so I encouraged her to continue this at home. She was able to tolerate increased weight with biceps, row and shoulder ext today.   ? PT Frequency 2x / week   ? PT Duration 12 weeks   ? PT Treatment/Interventions ADLs/Self Care Home Management;Cryotherapy;Electrical Stimulation;Moist Heat;Ultrasound;Functional mobility training;Therapeutic activities;Therapeutic exercise;Neuromuscular re-education;Patient/family education;Manual techniques;Scar mobilization;Passive range of motion;Dry needling;Taping;Vasopneumatic Device;Joint Manipulations   ? PT Next Visit Plan Rehab per RCR + Biceps tenodesis protocol - post-op week #8 as of 02/16/22 (sx 12/22/21) - initiate phase 3   ? PT Home Exercise Plan Access Code: [email or web address]   ? Consulted and Agree with Plan of Care Patient   ? ?  ?  ? ?  ? ? ?Patient will benefit from skilled therapeutic intervention in order to improve the following deficits and impairments:  Decreased activity tolerance, Decreased knowledge of precautions, Decreased mobility, Decreased range of motion, Decreased scar mobility, Decreased strength, Increased edema, Increased fascial restricitons, Increased muscle spasms, Impaired perceived functional ability, Impaired flexibility, Impaired UE functional use, Improper body mechanics, Postural dysfunction, Pain ? ?Visit Diagnosis: ?Stiffness of right shoulder, not elsewhere classified ? ?Acute pain of right shoulder ? ?Muscle weakness  (generalized) ? ?Abnormal posture ? ?Other symptoms and signs involving the musculoskeletal system ? ? ? ? ?Problem List ?Patient Active Problem List  ? Diagnosis Date Noted  ? Depression   ? ?Madelyn Flavors,  PT ?02/22/2022, 12:57 PM ? ?[facility] ?[facility] ?[address] ?[place], [place], [address] ?Phone: [phone]   Fax:  [phone] ? ?Name: Meredith Mccormick ?MRN: [id number] ?Date of Birth: 05-23-1956 ? ? ? ?

## 2022-02-26 ENCOUNTER — Other Ambulatory Visit: Payer: Self-pay

## 2022-02-26 ENCOUNTER — Encounter: Payer: Self-pay | Admitting: Physical Therapy

## 2022-02-26 ENCOUNTER — Ambulatory Visit: Payer: Commercial Managed Care - PPO | Admitting: Physical Therapy

## 2022-02-26 DIAGNOSIS — M6281 Muscle weakness (generalized): Secondary | ICD-10-CM

## 2022-02-26 DIAGNOSIS — R293 Abnormal posture: Secondary | ICD-10-CM

## 2022-02-26 DIAGNOSIS — M25511 Pain in right shoulder: Secondary | ICD-10-CM

## 2022-02-26 DIAGNOSIS — M25611 Stiffness of right shoulder, not elsewhere classified: Secondary | ICD-10-CM | POA: Diagnosis not present

## 2022-02-26 DIAGNOSIS — R29898 Other symptoms and signs involving the musculoskeletal system: Secondary | ICD-10-CM

## 2022-02-26 NOTE — Patient Instructions (Signed)
Access Code: [email or web address] ?URL: [email or web address] ?Date: 02/26/2022 ?Prepared by: Glenetta Hew ? ?Exercises ?Seated Forearm Pronation Supination AROM - 3 x daily - 7 x weekly - 2 sets - 10 reps - 3 sec hold ?Wrist Flexion AROM - 3 x daily - 7 x weekly - 2 sets - 10 reps - 3 sec hold ?Wrist Extension AROM - 3 x daily - 7 x weekly - 2 sets - 10 reps - 3 sec hold ?Towel Roll Squeeze - 3 x daily - 7 x weekly - 2 sets - 10 reps - 3 sec hold ?Supine Shoulder Flexion with Dowel (Mirrored) - 2 x daily - 7 x weekly - 1-2 sets - 10 reps - 5 sec hold ?Supine Shoulder External Rotation with Dowel (Mirrored) - 2 x daily - 7 x weekly - 1-2 sets - 10 reps - 5 seconds hold ?Wrist Flexor Stretch in Pronation - 2 x daily - 7 x weekly - 1 sets - 3 reps - 30 sec hold ?Supine Elbow Flexion Extension AROM - 1 x daily - 3 x weekly - 2 sets - 10 reps ?Seated Shoulder Flexion Towel Slide at Table Top - 2 x daily - 7 x weekly - 2 sets - 10 reps - 3-5 sec hold ?Seated Shoulder Abduction Towel Slide at Table Top - 1 x daily - 7 x weekly - 2 sets - 10 reps ?Seated Single Arm Bent Over Shoulder Row with Dumbbell - 1 x daily - 3 x weekly - 2 sets - 10 reps - 3 sec hold ?Single Arm Bent Over Shoulder Extension with Dumbbell - 1 x daily - 3 x weekly - 2 sets - 10 reps - 3 sec hold ?Supine Shoulder Abduction AROM (Mirrored) - 1 x daily - 3-4 x weekly - 1-3 sets - 10 reps ?Sidelying Shoulder Flexion 15 Degrees (Mirrored) - 1 x daily - 3-4 x weekly - 1-3 sets - 10 reps (deferred for now) ?Shoulder Flexion Wall Slide with Towel - 1 x daily - 7 x weekly - 1-3 sets - 10 reps ?Seated Cervical Sidebending Stretch - 2 x daily - 7 x weekly - 1 sets - 3 reps - 30-60 sec hold ?Supine Shoulder Circles - 1 x daily - 7 x weekly - 2 sets - 10 reps ? ?

## 2022-02-26 NOTE — Therapy (Signed)
[place] [facility] ?[facility] ?[address] ?[place], [place], [address] ?Phone: [phone]   Fax:  [phone] ? ?Physical Therapy Treatment ? ?Patient Details  ?Name: Meredith Mccormick ?MRN: [id number] ?Date of Birth: 1956/03/20 ?Referring Provider (PT): Justice Britain, MD ? ? ?Encounter Date: 02/26/2022 ? ? PT End of Session - 02/26/22 1316   ? ? Visit Number 13   ? Date for PT Re-Evaluation 03/22/22   ? Authorization Type UHC UMR   ? PT Start Time 1316   ? PT Stop Time 3557   ? PT Time Calculation (min) 41 min   ? Activity Tolerance Patient tolerated treatment well   ? Behavior During Therapy [facility] for tasks assessed/performed   ? ?  ?  ? ?  ? ? ?Past Medical History:  ?Diagnosis Date  ? Depression   ? Hypertension   ? ? ?Past Surgical History:  ?Procedure Laterality Date  ? ABDOMINAL HYSTERECTOMY    ? CERVICAL SPINE SURGERY    ? ? ?There were no vitals filed for this visit. ? ? Subjective Assessment - 02/26/22 1318   ? ? Subjective Pt reports increased pain following last visit. Took some ibuprofen and using the heating pad before coming today to hopefully reduce the pain.   ? Pertinent History R RTC repair & biceps tenodesis 12/22/21   ? 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 Currently in Pain? Yes   ? Pain Score 2    ? Pain Location Shoulder   ? Pain Orientation Right   ? Pain Descriptors / Indicators Dull;Throbbing   ? Pain Type Surgical pain;Acute pain   ? Pain Frequency Intermittent   ? ?  ?  ? ?  ? ? ? ? ? ? ? ? ? ? ? ? ? ? ? ? ? ? ? ? [place] Adult PT Treatment/Exercise - 02/26/22 1316   ? ?  ? Exercises  ? Exercises Shoulder   ?  ? Shoulder Exercises: Supine  ? ABduction Right;10 reps   ? ABduction Limitations with hand on washcloth; 2x5 - cues to keep palm facing up   ? Other Supine Exercises R shoulder CW/CCW circles with shoulder at 90? flexion (assistance required to assume position) x10 each direction    ?  ? Shoulder Exercises: Sidelying  ? External Rotation Right;10 reps;AROM   ? External Rotation Limitations cues for scap retraction   ? Flexion Right;5 reps;AAROM   ? ABduction Right;10 reps;AROM   ? ABduction Limitations to 90? ABD elbow at 90?   ?  ? Shoulder Exercises: Pulleys  ? Flexion 3 minutes   ?  ? Shoulder Exercises: Therapy Ball  ? Flexion Right;5 reps   ? Flexion Limitations orange Pball wall walk-up   ?  ? Shoulder Exercises: ROM/Strengthening  ? Wall Wash R shoulder flex in available range x 10   ? Rhythmic Stabilization, Supine R shoulder 1-2 finger perturbations at 90? flexion 2 x 20 sec   ? ?  ?  ? ?  ? ? ? ? ? ? ? ? ? ? ? ? PT Short Term Goals - 01/18/22 1456   ? ?  ? PT SHORT TERM GOAL #1  ? Title Patient will be independent with initial HEP   ? Status Achieved   01/18/22  ? ?  ?  ? ?  ? ? ? ? PT Long Term Goals -  02/26/22 1323   ? ?  ? PT LONG TERM GOAL #1  ? Title Patient will be independent with ongoing/advanced HEP for self-management at home   ? Status Partially Met   ? Target Date 03/22/22   ?  ? PT LONG TERM GOAL #2  ? Title Patient to improve R shoulder AROM to WFL/WNL (flexion 160?, abduction 140? and ER 75?) without pain provocation   ? Status On-going   ? Target Date 03/22/22   ?  ? PT LONG TERM GOAL #3  ? Title Patient will demonstrate improved R shoulder strength to >/= 4/5 to 4+/5 for functional UE use   ? Status On-going   ? Target Date 03/22/22   ?  ? PT LONG TERM GOAL #4  ? Title Patient to demonstrate R shoulder elevation to overhead shelf with 3-5# with good scapular mechanics and no evidence of compensations   ? Status On-going   ? Target Date 03/22/22   ?  ? PT LONG TERM GOAL #5  ? Title Patient to report ability to perform ADLs, household, and work-related tasks including computer and mouse use without limitation due to R shoulder pain, LOM or weakness   ? Status On-going   ? Target Date 03/22/22   ? ?  ?  ? ?  ? ? ? ? ? ? ? ? Plan - 02/26/22 1358   ? ? Clinical Impression  Statement Kashae is frustrated by her limited control of her R arm against gravity. She requested review of latest HEP update with cues necessary for proper hand positioning in supine R shoulder abduction and had difficulty suspending R UE with side-lying R shoulder flexion. Introduced light rhythmic stabilization and circles in supine with shoulder at 90? flexion to promote increased antigravity control - assist necessary to achieve position but then able to complete activity/exercise w/o increased pain. She continues to require close monitoring to avoid shoulder hike with all attempted overhead motions both in gravity-minimized and upright positions.   ? Comorbidities Cervical fusion, HTN, depression, anxiety, BMI > 30, 3 falls in past year   ? Rehab Potential Good   ? PT Frequency 2x / week   ? PT Duration 12 weeks   ? PT Treatment/Interventions ADLs/Self Care Home Management;Cryotherapy;Electrical Stimulation;Moist Heat;Ultrasound;Functional mobility training;Therapeutic activities;Therapeutic exercise;Neuromuscular re-education;Patient/family education;Manual techniques;Scar mobilization;Passive range of motion;Dry needling;Taping;Vasopneumatic Device;Joint Manipulations   ? PT Next Visit Plan Rehab per RCR + Biceps tenodesis protocol - post-op week #9 as of 02/23/22 (sx 12/22/21) - phase 3   ? PT Home Exercise Plan Access Code: [email or web address]   ? Consulted and Agree with Plan of Care Patient   ? ?  ?  ? ?  ? ? ?Patient will benefit from skilled therapeutic intervention in order to improve the following deficits and impairments:  Decreased activity tolerance, Decreased knowledge of precautions, Decreased mobility, Decreased range of motion, Decreased scar mobility, Decreased strength, Increased edema, Increased fascial restricitons, Increased muscle spasms, Impaired perceived functional ability, Impaired flexibility, Impaired UE functional use, Improper body mechanics, Postural dysfunction, Pain ? ?Visit  Diagnosis: ?Stiffness of right shoulder, not elsewhere classified ? ?Acute pain of right shoulder ? ?Muscle weakness (generalized) ? ?Abnormal posture ? ?Other symptoms and signs involving the musculoskeletal system ? ? ? ? ?Problem List ?Patient Active Problem List  ? Diagnosis Date Noted  ? Depression   ? ? ?JoAnne M Kreis, PT ?02/26/2022, 7:23 PM ? ?[place] ?[facility] ?[address]  [address] ?  [place], [place], [address] ?Phone: [phone]   Fax:  [phone] ? ?Name: Tanyla B Gehl ?MRN: [id number] ?Date of Birth: 03/05/1956 ? ? ? ?

## 2022-02-28 ENCOUNTER — Other Ambulatory Visit: Payer: Self-pay

## 2022-02-28 ENCOUNTER — Encounter: Payer: Self-pay | Admitting: Physical Therapy

## 2022-02-28 ENCOUNTER — Ambulatory Visit: Payer: Commercial Managed Care - PPO | Admitting: Physical Therapy

## 2022-02-28 DIAGNOSIS — M25611 Stiffness of right shoulder, not elsewhere classified: Secondary | ICD-10-CM

## 2022-02-28 DIAGNOSIS — M6281 Muscle weakness (generalized): Secondary | ICD-10-CM

## 2022-02-28 DIAGNOSIS — R29898 Other symptoms and signs involving the musculoskeletal system: Secondary | ICD-10-CM

## 2022-02-28 DIAGNOSIS — M25511 Pain in right shoulder: Secondary | ICD-10-CM

## 2022-02-28 DIAGNOSIS — R293 Abnormal posture: Secondary | ICD-10-CM

## 2022-02-28 NOTE — Therapy (Signed)
[place] ?[facility] ?[address] ?[place], [place], [address] ?Phone: [phone]   Fax:  [phone] ? ?Physical Therapy Treatment / Progress Note ? ?Patient Details  ?Name: Meredith Mccormick ?MRN: [id number] ?Date of Birth: 1956-02-12 ?Referring Provider (PT): Justice Britain, MD ? ? ? ?Progress Note ? ?Reporting Period 12/28/2021 to 02/28/2022 ? ?See note below for Objective Data and Assessment of Progress/Goals.  ? ? ? ?Encounter Date: 02/28/2022 ? ? PT End of Session - 02/28/22 1017   ? ? Visit Number 14   ? Date for PT Re-Evaluation 03/22/22   ? Authorization Type UHC UMR   ? PT Start Time 1017   ? PT Stop Time 1105   ? PT Time Calculation (min) 48 min   ? Activity Tolerance Patient tolerated treatment well   ? Behavior During Therapy [facility] for tasks assessed/performed   ? ?  ?  ? ?  ? ? ?Past Medical History:  ?Diagnosis Date  ? Depression   ? Hypertension   ? ? ?Past Surgical History:  ?Procedure Laterality Date  ? ABDOMINAL HYSTERECTOMY    ? CERVICAL SPINE SURGERY    ? ? ?There were no vitals filed for this visit. ? ? Subjective Assessment - 02/28/22 1019   ? ? Subjective Pt reports she has been really trying to focus on keeping her upper shoulder relaxed but not sure how well she is doing.   ? Pertinent History R RTC repair & biceps tenodesis 12/22/21   ? 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 Currently in Pain? No/denies   ? ?  ?  ? ?  ? ? ? ? ? [facility] PT Assessment - 02/28/22 1017   ? ?  ? Assessment  ? Medical Diagnosis R RCR + bicpes tenodesis   ? Referring Provider (PT) Justice Britain, MD   ? Onset Date/Surgical Date 12/22/21   ? Next MD Visit 03/05/22   ?  ? AROM  ? Overall AROM Comments measured in supine   ? Right Shoulder Flexion 120 Degrees   ? Right Shoulder ABduction 67 Degrees   ? Right Shoulder Internal Rotation 65 Degrees   at ~45? abduction  ? Right Shoulder External Rotation 45 Degrees   at  ~45? abduction  ?  ? PROM  ? Right Shoulder Flexion 149 Degrees   ? Right Shoulder ABduction 131 Degrees   ? Right Shoulder Internal Rotation 72 Degrees   ? Right Shoulder External Rotation 56 Degrees   ? ?  ?  ? ?  ? ? ? ? ? ? ? ? ? ? ? ? ? ? ? ? [place] Adult PT Treatment/Exercise - 02/28/22 1017   ? ?  ? Shoulder Exercises: Supine  ? Protraction Right;10 reps;AROM;AAROM   ? Protraction Limitations PT guidance to reduce shoulder hike/shrug   ? Other Supine Exercises R shoulder CW/CCW circles with shoulder at 90? flexion (assistance required to assume position) 2 x 10 each direction   ?  ? Shoulder Exercises: Pulleys  ? Flexion 3 minutes   ? Scaption 3 minutes   ?  ? Shoulder Exercises: ROM/Strengthening  ? Rhythmic Stabilization, Supine R shoulder 1-2 finger perturbations at 90? flexion 2 x 30 sec, 1st set at elbow, 2nd set at wrist   ?  ? Manual Therapy  ? Manual Therapy Joint mobilization;Soft tissue mobilization;Myofascial release;Passive ROM   ? Joint  Mobilization R shoulder inf and A/P glides grade II-III   ? Soft tissue mobilization to R UT, anterior and lateral deltoid   ? Myofascial Release manual TPR to R UT, anterior and lateral deltoid   ? Passive ROM R shoulder all motions to tolerance   ? ?  ?  ? ?  ? ? ? ? ? ? ? ? ? ? ? ? PT Short Term Goals - 01/18/22 1456   ? ?  ? PT SHORT TERM GOAL #1  ? Title Patient will be independent with initial HEP   ? Status Achieved   01/18/22  ? ?  ?  ? ?  ? ? ? ? PT Long Term Goals - 02/26/22 1323   ? ?  ? PT LONG TERM GOAL #1  ? Title Patient will be independent with ongoing/advanced HEP for self-management at home   ? Status Partially Met   ? Target Date 03/22/22   ?  ? PT LONG TERM GOAL #2  ? Title Patient to improve R shoulder AROM to WFL/WNL (flexion 160?, abduction 140? and ER 75?) without pain provocation   ? Status On-going   ? Target Date 03/22/22   ?  ? PT LONG TERM GOAL #3  ? Title Patient will demonstrate improved R shoulder strength to >/= 4/5 to 4+/5 for  functional UE use   ? Status On-going   ? Target Date 03/22/22   ?  ? PT LONG TERM GOAL #4  ? Title Patient to demonstrate R shoulder elevation to overhead shelf with 3-5# with good scapular mechanics and no evidence of compensations   ? Status On-going   ? Target Date 03/22/22   ?  ? PT LONG TERM GOAL #5  ? Title Patient to report ability to perform ADLs, household, and work-related tasks including computer and mouse use without limitation due to R shoulder pain, LOM or weakness   ? Status On-going   ? Target Date 03/22/22   ? ?  ?  ? ?  ? ? ? ? ? ? ? ? Plan - 02/28/22 1105   ? ? Clinical Impression Statement Dalanie reports trying to be more aware of not hiking her R shoulder with attempts at movement/exercises at home but continues to demonstrate increased R shoulder shrug/hike with most attempts at A/AAROM or stabilization exercises during PT sessions. Available R shoulder PROM continues to improve but significant limited AROM control/weakness with tendency for substitution with shoulder hike still limiting functional use of R UE. Improving control noted with supine rhythmic stabilization exercises but assist necessary to assume position and increased pain reported in lateral upper arm when attempting to lower arm from 90? flexion. [place] will continue to benefit from skilled PT to restore functional ROM and strength in R shoulder to allow return to PLOF and will like require recert at end of current certification period on 03/22/22.   ? Comorbidities Cervical fusion, HTN, depression, anxiety, BMI > 30, 3 falls in past year   ? Rehab Potential Good   ? PT Frequency 2x / week   ? PT Duration 12 weeks   ? PT Treatment/Interventions ADLs/Self Care Home Management;Cryotherapy;Electrical Stimulation;Moist Heat;Ultrasound;Functional mobility training;Therapeutic activities;Therapeutic exercise;Neuromuscular re-education;Patient/family education;Manual techniques;Scar mobilization;Passive range of motion;Dry  needling;Taping;Vasopneumatic Device;Joint Manipulations   ? PT Next Visit Plan Rehab per RCR + Biceps tenodesis protocol - post-op week #9 as of 02/23/22 (sx 12/22/21) - phase 3   ? PT Home Exercise Plan Access Code: [email or web address]   ?  Consulted and Agree with Plan of Care Patient   ? ?  ?  ? ?  ? ? ?Patient will benefit from skilled therapeutic intervention in order to improve the following deficits and impairments:  Decreased activity tolerance, Decreased knowledge of precautions, Decreased mobility, Decreased range of motion, Decreased scar mobility, Decreased strength, Increased edema, Increased fascial restricitons, Increased muscle spasms, Impaired perceived functional ability, Impaired flexibility, Impaired UE functional use, Improper body mechanics, Postural dysfunction, Pain ? ?Visit Diagnosis: ?Stiffness of right shoulder, not elsewhere classified ? ?Acute pain of right shoulder ? ?Muscle weakness (generalized) ? ?Abnormal posture ? ?Other symptoms and signs involving the musculoskeletal system ? ? ? ? ?Problem List ?Patient Active Problem List  ? Diagnosis Date Noted  ? Depression   ? ? ?Percival Spanish, PT ?02/28/2022, 1:59 PM ? ?[place] ?[facility] ?[address] ?[place], [place], [address] ?Phone: [phone]   Fax:  [phone] ? ?Name: MEEGAN SHANAFELT ?MRN: [id number] ?Date of Birth: 1956/08/30 ? ? ? ?

## 2022-03-13 ENCOUNTER — Encounter: Payer: Self-pay | Admitting: Physical Therapy

## 2022-03-13 ENCOUNTER — Ambulatory Visit: Payer: Commercial Managed Care - PPO | Admitting: Physical Therapy

## 2022-03-13 ENCOUNTER — Other Ambulatory Visit: Payer: Self-pay

## 2022-03-13 DIAGNOSIS — R293 Abnormal posture: Secondary | ICD-10-CM

## 2022-03-13 DIAGNOSIS — M25611 Stiffness of right shoulder, not elsewhere classified: Secondary | ICD-10-CM | POA: Diagnosis not present

## 2022-03-13 DIAGNOSIS — M25511 Pain in right shoulder: Secondary | ICD-10-CM

## 2022-03-13 DIAGNOSIS — M6281 Muscle weakness (generalized): Secondary | ICD-10-CM

## 2022-03-13 DIAGNOSIS — R29898 Other symptoms and signs involving the musculoskeletal system: Secondary | ICD-10-CM

## 2022-03-13 NOTE — Therapy (Signed)
[place] ?[facility] ?[place] ?[place], [place], [address] ?Phone: [phone]   Fax:  [phone] ? ?Physical Therapy Treatment ? ?Patient Details  ?Name: Meredith Mccormick ?MRN: [id number] ?Date of Birth: 04-17-1956 ?Referring Provider (PT): Justice Britain, MD ? ? ?Encounter Date: 03/13/2022 ? ? PT End of Session - 03/13/22 1150   ? ? Visit Number 15   ? Date for PT Re-Evaluation 03/22/22   ? Authorization Type UHC UMR   ? PT Start Time 1145   ? PT Stop Time 1233   ? PT Time Calculation (min) 48 min   ? Activity Tolerance Patient tolerated treatment well   ? Behavior During Therapy [facility] for tasks assessed/performed   ? ?  ?  ? ?  ? ? ?Past Medical History:  ?Diagnosis Date  ? Depression   ? Hypertension   ? ? ?Past Surgical History:  ?Procedure Laterality Date  ? ABDOMINAL HYSTERECTOMY    ? CERVICAL SPINE SURGERY    ? ? ?There were no vitals filed for this visit. ? ? Subjective Assessment - 03/13/22 1147   ? ? Subjective She had a steroid injection 03/05/22 and it has made a world of difference.   ? 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 Currently in Pain? No/denies   ? ?  ?  ? ?  ? ? ? ? ? [facility] PT Assessment - 03/13/22 0001   ? ?  ? Assessment  ? Next MD Visit 04/04/22   RTW 03/23/22  ?  ? AROM  ? Right Shoulder Flexion 160 Degrees   ? Right Shoulder ABduction 90 Degrees   ? Right Shoulder Internal Rotation 50 Degrees   at 90 deg ABD  ? Right Shoulder External Rotation 34 Degrees   at 90 deg ABD  ?  ? PROM  ? Right Shoulder External Rotation 40 Degrees   at 90 deg ABD  ? ?  ?  ? ?  ? ? ? ? ? ? ? ? ? ? ? ? ? ? ? ? [facility] Adult PT Treatment/Exercise - 03/13/22 0001   ? ?  ? Elbow Exercises  ? Elbow Flexion Strengthening;Right;5 reps   ? Bar Weights/Barbell (Elbow Flexion) 2 lbs   ?  ? Shoulder Exercises: Supine  ? External Rotation AAROM;Both;5 reps   ? External Rotation Limitations with cane; at 90 deg ABD  (goal post position); also with shoulder at 45 deg   ?  ? Shoulder Exercises: Sidelying  ? External Rotation Right;10 reps   ? ABduction Right;10 reps;AROM   ? Other Sidelying Exercises horizontal ABD x 10   ?  ? Shoulder Exercises: Standing  ? Flexion Right;10 reps   ? Flexion Limitations to 90 deg; cues to avoid shoulder hike; attemtped scaption x 5 but fatigued; also tried cabinet reaches x 5 but fatigued   ? Retraction Both;10 reps;20 reps   ? Retraction Limitations L, W and goal post x 10 ea wth noodle   ?  ? Shoulder Exercises: Pulleys  ? Flexion 3 minutes   ? Scaption 3 minutes   ?  ? Shoulder Exercises: ROM/Strengthening  ? Wall Wash R shoulder flex in available range x 10, scaption and ABD x 5 ea   ?  ? Shoulder Exercises: Stretch  ? Internal Rotation Stretch 2 reps   ? Internal Rotation Stretch Limitations behind back x 10  sec each   ? External Rotation Stretch 1 rep;20 seconds   hand behind head  ?  ? Manual Therapy  ? Manual Therapy Passive ROM   ? Manual therapy comments with measurements   ? ?  ?  ? ?  ? ? ? ? ? ? ? ? ? ? PT Education - 03/13/22 1232   ? ? Education Details HEP progressed  with strengthening   ? Person(s) Educated Patient   ? Methods Explanation;Demonstration;Handout   ? Comprehension Verbalized understanding;Returned demonstration   ? ?  ?  ? ?  ? ? ? PT Short Term Goals - 01/18/22 1456   ? ?  ? PT SHORT TERM GOAL #1  ? Title Patient will be independent with initial HEP   ? Status Achieved   01/18/22  ? ?  ?  ? ?  ? ? ? ? PT Long Term Goals - 02/26/22 1323   ? ?  ? PT LONG TERM GOAL #1  ? Title Patient will be independent with ongoing/advanced HEP for self-management at home   ? Status Partially Met   ? Target Date 03/22/22   ?  ? PT LONG TERM GOAL #2  ? Title Patient to improve R shoulder AROM to WFL/WNL (flexion 160?, abduction 140? and ER 75?) without pain provocation   ? Status On-going   ? Target Date 03/22/22   ?  ? PT LONG TERM GOAL #3  ? Title Patient will demonstrate improved R  shoulder strength to >/= 4/5 to 4+/5 for functional UE use   ? Status On-going   ? Target Date 03/22/22   ?  ? PT LONG TERM GOAL #4  ? Title Patient to demonstrate R shoulder elevation to overhead shelf with 3-5# with good scapular mechanics and no evidence of compensations   ? Status On-going   ? Target Date 03/22/22   ?  ? PT LONG TERM GOAL #5  ? Title Patient to report ability to perform ADLs, household, and work-related tasks including computer and mouse use without limitation due to R shoulder pain, LOM or weakness   ? Status On-going   ? Target Date 03/22/22   ? ?  ?  ? ?  ? ? ? ? ? ? ? ? Plan - 03/13/22 1239   ? ? Clinical Impression Statement Patient reports she had a steroid injection which has helped significantly with pain and with AROM. She has improved in all planes, We intiated more strengthening and patient fatigues easily. Standing flex and sdly ABD and ER were issued for HEP. Patient would like to review HEP next visit and eliminate exercises where she can. I told her ROM is still very important and to select the exercises that give her the most benefit in each direction. Wrist exercises can likely be d/c'd and biceps curls can be moved to sitting or standing.   ? PT Frequency 2x / week   ? PT Duration 12 weeks   ? PT Treatment/Interventions ADLs/Self Care Home Management;Cryotherapy;Electrical Stimulation;Moist Heat;Ultrasound;Functional mobility training;Therapeutic activities;Therapeutic exercise;Neuromuscular re-education;Patient/family education;Manual techniques;Scar mobilization;Passive range of motion;Dry needling;Taping;Vasopneumatic Device;Joint Manipulations   ? PT Next Visit Plan Rehab per RCR + Biceps tenodesis protocol - post-op week #11 as of 03/09/22 (sx 12/22/21) - phase 3   ? PT Home Exercise Plan Access Code: [email or web address]   ? Consulted and Agree with Plan of Care Patient   ? ?  ?  ? ?  ? ? ?Patient will benefit from skilled therapeutic  intervention in order to improve the following  deficits and impairments:  Decreased activity tolerance, Decreased knowledge of precautions, Decreased mobility, Decreased range of motion, Decreased scar mobility, Decreased strength, Increased edema, Increased fascial restricitons, Increased muscle spasms, Impaired perceived functional ability, Impaired flexibility, Impaired UE functional use, Improper body mechanics, Postural dysfunction, Pain ? ?Visit Diagnosis: ?Stiffness of right shoulder, not elsewhere classified ? ?Acute pain of right shoulder ? ?Muscle weakness (generalized) ? ?Abnormal posture ? ?Other symptoms and signs involving the musculoskeletal system ? ? ? ? ?Problem List ?Patient Active Problem List  ? Diagnosis Date Noted  ? Depression   ? ? ?Madelyn Flavors, PT ?03/13/2022, 12:45 PM ? ?[place] ?[facility] ?[place] ?[place], [place], [address] ?Phone: [phone]   Fax:  [phone] ? ?Name: Meredith Mccormick ?MRN: [id number] ?Date of Birth: [birth year] ? ? ? ?

## 2022-03-13 NOTE — Patient Instructions (Signed)
Access Code: [email or web address] ?URL: [email or web address] ?Date: 03/13/2022 ?Prepared by: Almyra Free ? ?Exercises ?- Seated Forearm Pronation Supination AROM  - 3 x daily - 7 x weekly - 2 sets - 10 reps - 3 sec hold ?- Wrist Flexion AROM  - 3 x daily - 7 x weekly - 2 sets - 10 reps - 3 sec hold ?- Wrist Extension AROM  - 3 x daily - 7 x weekly - 2 sets - 10 reps - 3 sec hold ?- Towel Roll Squeeze  - 3 x daily - 7 x weekly - 2 sets - 10 reps - 3 sec hold ?- Supine Shoulder Flexion with Dowel (Mirrored)  - 2 x daily - 7 x weekly - 1-2 sets - 10 reps - 5 sec hold ?- Supine Shoulder External Rotation with Dowel (Mirrored)  - 2 x daily - 7 x weekly - 1-2 sets - 10 reps - 5 seconds hold ?- Wrist Flexor Stretch in Pronation  - 2 x daily - 7 x weekly - 1 sets - 3 reps - 30 sec hold ?- Supine Elbow Flexion Extension AROM  - 1 x daily - 3 x weekly - 2 sets - 10 reps ?- Seated Shoulder Flexion Towel Slide at Table Top  - 2 x daily - 7 x weekly - 2 sets - 10 reps - 3-5 sec hold ?- Seated Shoulder Abduction Towel Slide at Table Top  - 1 x daily - 7 x weekly - 2 sets - 10 reps ?- Seated Single Arm Bent Over Shoulder Row with Dumbbell  - 1 x daily - 3 x weekly - 2 sets - 10 reps - 3 sec hold ?- Single Arm Bent Over Shoulder Extension with Dumbbell  - 1 x daily - 3 x weekly - 2 sets - 10 reps - 3 sec hold ?- Sidelying Shoulder Flexion 15 Degrees (Mirrored)  - 1 x daily - 3-4 x weekly - 1-3 sets - 10 reps ?- Shoulder Flexion Wall Slide with Towel  - 1 x daily - 7 x weekly - 1-3 sets - 10 reps ?- Seated Cervical Sidebending Stretch  - 2 x daily - 7 x weekly - 1 sets - 3 reps - 30-60 sec hold ?- Supine Shoulder Circles  - 1 x daily - 7 x weekly - 2 sets - 10 reps ?- Sidelying Shoulder Abduction Palm Forward  - 1 x daily - 7 x weekly - 1-3 sets - 10 reps ?- Sidelying Shoulder External Rotation AROM  - 1 x daily - 7 x weekly - 1-3 sets - 10 reps ?- Standing Shoulder Flexion to 90 Degrees  - 1 x daily - 7 x weekly - 1-3 sets - 10 reps ?

## 2022-03-14 ENCOUNTER — Ambulatory Visit: Payer: Commercial Managed Care - PPO

## 2022-03-14 DIAGNOSIS — M6281 Muscle weakness (generalized): Secondary | ICD-10-CM

## 2022-03-14 DIAGNOSIS — M25611 Stiffness of right shoulder, not elsewhere classified: Secondary | ICD-10-CM

## 2022-03-14 DIAGNOSIS — M25511 Pain in right shoulder: Secondary | ICD-10-CM

## 2022-03-14 DIAGNOSIS — R29898 Other symptoms and signs involving the musculoskeletal system: Secondary | ICD-10-CM

## 2022-03-14 DIAGNOSIS — R293 Abnormal posture: Secondary | ICD-10-CM

## 2022-03-14 NOTE — Patient Instructions (Signed)
Access Code: [email or web address] ?URL: [email or web address] ?Date: 03/14/2022 ?Prepared by: Verta Ellen ? ?Exercises ?- Supine Shoulder Flexion with Dowel (Mirrored)  - 2 x daily - 7 x weekly - 1-2 sets - 10 reps - 5 sec hold ?- Supine Shoulder External Rotation with Dowel (Mirrored)  - 2 x daily - 7 x weekly - 1-2 sets - 10 reps - 5 seconds hold ?- Sidelying Shoulder Flexion 15 Degrees (Mirrored)  - 1 x daily - 3-4 x weekly - 1-3 sets - 10 reps ?- Shoulder Flexion Wall Slide with Towel  - 1 x daily - 7 x weekly - 1-3 sets - 10 reps ?- Seated Cervical Sidebending Stretch  - 2 x daily - 7 x weekly - 1 sets - 3 reps - 30-60 sec hold ?- Sidelying Shoulder Abduction Palm Forward  - 1 x daily - 7 x weekly - 1-3 sets - 10 reps ?- Sidelying Shoulder External Rotation AROM  - 1 x daily - 7 x weekly - 1-3 sets - 10 reps ?- Standing Shoulder Flexion to 90 Degrees  - 1 x daily - 7 x weekly - 1-3 sets - 10 reps ?- Standing Bilateral Low Shoulder Row with Anchored Resistance  - 1 x daily - 3-4 x weekly - 3 sets - 10 reps ?- Shoulder Extension with Resistance  - 1 x daily - 3-4 x weekly - 3 sets - 10 reps ?

## 2022-03-14 NOTE — Therapy (Signed)
[place] ?[facility] ?[place] ?[place], [place], [address] ?Phone: [phone]   Fax:  [phone] ? ?Physical Therapy Treatment ? ?Patient Details  ?Name: Meredith Mccormick ?MRN: [id number] ?Date of Birth: 1956/08/24 ?Referring Provider (PT): Justice Britain, MD ? ? ?Encounter Date: 03/14/2022 ? ? PT End of Session - 03/14/22 0930   ? ? Visit Number 16   ? Date for PT Re-Evaluation 03/22/22   ? Authorization Type UHC UMR   ? PT Start Time [phone]   ? PT Stop Time 0930   ? PT Time Calculation (min) 43 min   ? Activity Tolerance Patient tolerated treatment well   ? Behavior During Therapy [facility] for tasks assessed/performed   ? ?  ?  ? ?  ? ? ?Past Medical History:  ?Diagnosis Date  ? Depression   ? Hypertension   ? ? ?Past Surgical History:  ?Procedure Laterality Date  ? ABDOMINAL HYSTERECTOMY    ? CERVICAL SPINE SURGERY    ? ? ?There were no vitals filed for this visit. ? ? Subjective Assessment - 03/14/22 0845   ? ? Subjective "I brought in my exercises today to condense them."   ? Pertinent History R RTC repair & biceps tenodesis 12/22/21   ? 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 Currently in Pain? Yes   ? Pain Score 1    ? Pain Location Shoulder   ? Pain Orientation Right   ? Pain Descriptors / Indicators Dull   ? Pain Type Surgical pain   ? ?  ?  ? ?  ? ? ? ? ? ? ? ? ? ? ? ? ? ? ? ? ? ? ? ? [facility] Adult PT Treatment/Exercise - 03/14/22 0001   ? ?  ? Shoulder Exercises: Sidelying  ? External Rotation Right;10 reps   ? External Rotation Limitations 2 sets; cues to keep elbow to side   ? ABduction Right;10 reps;AROM   2 sets  ? Other Sidelying Exercises horizontal ABD 2 x 10   ?  ? Shoulder Exercises: Standing  ? Flexion Strengthening;Both;10 reps   ? Flexion Limitations to 90 deg; cues to avoid shoulder hike   ? Extension Strengthening;Both;10 reps;Theraband   ? Theraband Level (Shoulder Extension) Level 3  (Green)   ? Extension Weight (lbs) CW/CCW circles on wall x 10 each   ? Row Strengthening;Both;10 reps;Theraband   ? Theraband Level (Shoulder Row) Level 3 (Green)   ? Row Limitations cues for scap retraction   ? Other Standing Exercises cabinet reaches  R UE to 2nd shelf x 10   ? Other Standing Exercises prayer AAROM into flexion x 10   ?  ? Shoulder Exercises: Pulleys  ? Flexion 3 minutes   ? Scaption 3 minutes   ? ?  ?  ? ?  ? ? ? ? ? ? ? ? ? ? PT Education - 03/14/22 0930   ? ? Education Details condensed HEP and gave exercises to focus on from here on out   ? Person(s) Educated Patient   ? Methods Explanation;Demonstration;Handout   ? Comprehension Verbalized understanding;Returned demonstration   ? ?  ?  ? ?  ? ? ? PT Short Term Goals - 01/18/22 1456   ? ?  ? PT SHORT TERM GOAL #1  ? Title Patient will be independent with initial HEP   ?  Status Achieved   01/18/22  ? ?  ?  ? ?  ? ? ? ? PT Long Term Goals - 02/26/22 1323   ? ?  ? PT LONG TERM GOAL #1  ? Title Patient will be independent with ongoing/advanced HEP for self-management at home   ? Status Partially Met   ? Target Date 03/22/22   ?  ? PT LONG TERM GOAL #2  ? Title Patient to improve R shoulder AROM to WFL/WNL (flexion 160?, abduction 140? and ER 75?) without pain provocation   ? Status On-going   ? Target Date 03/22/22   ?  ? PT LONG TERM GOAL #3  ? Title Patient will demonstrate improved R shoulder strength to >/= 4/5 to 4+/5 for functional UE use   ? Status On-going   ? Target Date 03/22/22   ?  ? PT LONG TERM GOAL #4  ? Title Patient to demonstrate R shoulder elevation to overhead shelf with 3-5# with good scapular mechanics and no evidence of compensations   ? Status On-going   ? Target Date 03/22/22   ?  ? PT LONG TERM GOAL #5  ? Title Patient to report ability to perform ADLs, household, and work-related tasks including computer and mouse use without limitation due to R shoulder pain, LOM or weakness   ? Status On-going   ? Target Date 03/22/22    ? ?  ?  ? ?  ? ? ? ? ? ? ? ? Plan - 03/14/22 0932   ? ? Clinical Impression Statement Reviewed HEP today and condensed per pt request. Pt noted ease with seated rows and ext, so I added standing versions with TB. Reivewed the exercises given yesterday with cues given with S/L ER to keep elbow to side. Tactile cuing required for depression and upward rotation of her scapula with standing shoulder flexion. Pt would continue to benefit from work on functional strengthening.   ? Personal Factors and Comorbidities Age;Comorbidity 3+;Fitness;Past/Current Experience;Time since onset of injury/illness/exacerbation   ? Comorbidities Cervical fusion, HTN, depression, anxiety, BMI > 30, 3 falls in past year   ? PT Frequency 2x / week   ? PT Duration 12 weeks   ? PT Treatment/Interventions ADLs/Self Care Home Management;Cryotherapy;Electrical Stimulation;Moist Heat;Ultrasound;Functional mobility training;Therapeutic activities;Therapeutic exercise;Neuromuscular re-education;Patient/family education;Manual techniques;Scar mobilization;Passive range of motion;Dry needling;Taping;Vasopneumatic Device;Joint Manipulations   ? PT Next Visit Plan Rehab per RCR + Biceps tenodesis protocol - post-op week #12 as of 03/16/22 (sx 12/22/21) - phase 3   ? PT Home Exercise Plan Access Code: [email or web address]   ? Consulted and Agree with Plan of Care Patient   ? ?  ?  ? ?  ? ? ?Patient will benefit from skilled therapeutic intervention in order to improve the following deficits and impairments:  Decreased activity tolerance, Decreased knowledge of precautions, Decreased mobility, Decreased range of motion, Decreased scar mobility, Decreased strength, Increased edema, Increased fascial restricitons, Increased muscle spasms, Impaired perceived functional ability, Impaired flexibility, Impaired UE functional use, Improper body mechanics, Postural dysfunction, Pain ? ?Visit Diagnosis: ?Stiffness of right shoulder, not elsewhere classified ? ?Acute pain of  right shoulder ? ?Muscle weakness (generalized) ? ?Abnormal posture ? ?Other symptoms and signs involving the musculoskeletal system ? ? ? ? ?Problem List ?Patient Active Problem List  ? Diagnosis Date Noted  ? Depression   ? ? ?Artist Pais, PTA ?03/14/2022, 10:24 AM ? ?[place] ?[facility] ?[address] [place] ?[place],  [place], [address] ?Phone: [phone]   Fax:  [phone] ? ?Name: Meredith Mccormick ?MRN: [id number] ?Date of Birth: 05-May-1956 ? ? ? ?

## 2022-03-20 ENCOUNTER — Ambulatory Visit: Payer: Commercial Managed Care - PPO | Attending: Orthopedic Surgery

## 2022-03-20 DIAGNOSIS — R29898 Other symptoms and signs involving the musculoskeletal system: Secondary | ICD-10-CM | POA: Insufficient documentation

## 2022-03-20 DIAGNOSIS — M25511 Pain in right shoulder: Secondary | ICD-10-CM | POA: Diagnosis present

## 2022-03-20 DIAGNOSIS — M25611 Stiffness of right shoulder, not elsewhere classified: Secondary | ICD-10-CM | POA: Insufficient documentation

## 2022-03-20 DIAGNOSIS — M6281 Muscle weakness (generalized): Secondary | ICD-10-CM | POA: Insufficient documentation

## 2022-03-20 DIAGNOSIS — R293 Abnormal posture: Secondary | ICD-10-CM | POA: Diagnosis present

## 2022-03-20 NOTE — Therapy (Signed)
[place] ?[facility] ?[place] ?[place], [place], [address] ?Phone: [phone]   Fax:  [phone] ? ?Physical Therapy Treatment ? ?Patient Details  ?Name: Meredith Mccormick ?MRN: [id number] ?Date of Birth: Nov 22, 1956 ?Referring Provider (PT): Justice Britain, MD ? ? ?Encounter Date: 03/20/2022 ? ? PT End of Session - 03/20/22 1020   ? ? Visit Number 17   ? Date for PT Re-Evaluation 03/22/22   ? Authorization Type UHC UMR   ? PT Start Time [phone]   ? PT Stop Time 1014   ? PT Time Calculation (min) 41 min   ? Activity Tolerance Patient tolerated treatment well   ? Behavior During Therapy [facility] for tasks assessed/performed   ? ?  ?  ? ?  ? ? ?Past Medical History:  ?Diagnosis Date  ? Depression   ? Hypertension   ? ? ?Past Surgical History:  ?Procedure Laterality Date  ? ABDOMINAL HYSTERECTOMY    ? CERVICAL SPINE SURGERY    ? ? ?There were no vitals filed for this visit. ? ? Subjective Assessment - 03/20/22 0939   ? ? Subjective Only a little of pain today. "I notice I get tired easily from doing all of my exercises."   ? Pertinent History R RTC repair & biceps tenodesis 12/22/21   ? 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 Currently in Pain? Yes   ? Pain Score 1    ? Pain Location Shoulder   ? Pain Orientation Right   ? Pain Descriptors / Indicators Dull   ? Pain Type Surgical pain   ? ?  ?  ? ?  ? ? ? ? ? [facility] PT Assessment - 03/20/22 0001   ? ?  ? AROM  ? Right Shoulder Flexion 91 Degrees   seated  ? Right Shoulder ABduction 95 Degrees   seated; supine 144 deg  ? Right Shoulder Internal Rotation 60 Degrees   45 deg abd  ? Right Shoulder External Rotation 41 Degrees   45 deg abd  ? ?  ?  ? ?  ? ? ? ? ? ? ? ? ? ? ? ? ? ? ? ? [facility] Adult PT Treatment/Exercise - 03/20/22 0001   ? ?  ? Shoulder Exercises: Sidelying  ? External Rotation Right;15 reps;Weights   ? External Rotation Weight (lbs) 2   ?  ? Shoulder  Exercises: Standing  ? Other Standing Exercises cabinet reaches  B UE to 2nd shelf x 20; prayer with hands   ? Other Standing Exercises prayer AAROM with WB into wall flexion 2 x 10   ?  ? Shoulder Exercises: Pulleys  ? Flexion 3 minutes   ? Scaption 3 minutes   ?  ? Shoulder Exercises: ROM/Strengthening  ? Other ROM/Strengthening Exercises L AAROM into ER standing with cane 10x5"   ? ?  ?  ? ?  ? ? ? ? ? ? ? ? ? ? ? ? PT Short Term Goals - 01/18/22 1456   ? ?  ? PT SHORT TERM GOAL #1  ? Title Patient will be independent with initial HEP   ? Status Achieved   01/18/22  ? ?  ?  ? ?  ? ? ? ? PT Long Term Goals - 03/20/22 0952   ? ?  ? PT LONG TERM GOAL #1  ? Title Patient will be  independent with ongoing/advanced HEP for self-management at home   ? Status Partially Met   ? Target Date 03/22/22   ?  ? PT LONG TERM GOAL #2  ? Title Patient to improve R shoulder AROM to WFL/WNL (flexion 160?, abduction 140? and ER 75?) without pain provocation   ? Status On-going   met for flex and ABD in supine  ? Target Date 03/22/22   ?  ? PT LONG TERM GOAL #3  ? Title Patient will demonstrate improved R shoulder strength to >/= 4/5 to 4+/5 for functional UE use   ? Status On-going   ? Target Date 03/22/22   ?  ? PT LONG TERM GOAL #4  ? Title Patient to demonstrate R shoulder elevation to overhead shelf with 3-5# with good scapular mechanics and no evidence of compensations   ? Status On-going   ? Target Date 03/22/22   ?  ? PT LONG TERM GOAL #5  ? Title Patient to report ability to perform ADLs, household, and work-related tasks including computer and mouse use without limitation due to R shoulder pain, LOM or weakness   ? Status On-going   ? Target Date 03/22/22   ? ?  ?  ? ?  ? ? ? ? ? ? ? ? Plan - 03/20/22 1021   ? ? Clinical Impression Statement Pt still shows deficits with OH reaching into cabinets with the R UE alone and even with assistance with the L UE. Also noted that she gets much shoulder fatigue after doing her HEP, so I  gave her options to do half of HEP one day and the other half another day. Tried isometric step out into ER but pt unable to keep arm in neutral ER.   ? Personal Factors and Comorbidities Age;Comorbidity 3+;Fitness;Past/Current Experience;Time since onset of injury/illness/exacerbation   ? Comorbidities Cervical fusion, HTN, depression, anxiety, BMI > 30, 3 falls in past year   ? PT Frequency 2x / week   ? PT Duration 12 weeks   ? PT Treatment/Interventions ADLs/Self Care Home Management;Cryotherapy;Electrical Stimulation;Moist Heat;Ultrasound;Functional mobility training;Therapeutic activities;Therapeutic exercise;Neuromuscular re-education;Patient/family education;Manual techniques;Scar mobilization;Passive range of motion;Dry needling;Taping;Vasopneumatic Device;Joint Manipulations   ? PT Next Visit Plan Rehab per RCR + Biceps tenodesis protocol - post-op week #12 as of 03/16/22 (sx 12/22/21) - phase 3   ? PT Home Exercise Plan Access Code: [email or web address]   ? Consulted and Agree with Plan of Care Patient   ? ?  ?  ? ?  ? ? ?Patient will benefit from skilled therapeutic intervention in order to improve the following deficits and impairments:  Decreased activity tolerance, Decreased knowledge of precautions, Decreased mobility, Decreased range of motion, Decreased scar mobility, Decreased strength, Increased edema, Increased fascial restricitons, Increased muscle spasms, Impaired perceived functional ability, Impaired flexibility, Impaired UE functional use, Improper body mechanics, Postural dysfunction, Pain ? ?Visit Diagnosis: ?Stiffness of right shoulder, not elsewhere classified ? ?Acute pain of right shoulder ? ?Muscle weakness (generalized) ? ?Abnormal posture ? ?Other symptoms and signs involving the musculoskeletal system ? ? ? ? ?Problem List ?Patient Active Problem List  ? Diagnosis Date Noted  ? Depression   ? ? ?Artist Pais, PTA ?03/20/2022, 12:10 PM ? ?[place] ?[facility] ?[place] ?[place], [place], [address] ?Phone: [phone]   Fax:  [phone] ? ?Name: Meredith Mccormick ?MRN: [id number] ?Date of Birth: 01/13/1956 ? ? ? ?

## 2022-03-21 ENCOUNTER — Encounter: Payer: Self-pay | Admitting: Physical Therapy

## 2022-03-21 ENCOUNTER — Ambulatory Visit: Payer: Commercial Managed Care - PPO | Admitting: Physical Therapy

## 2022-03-21 DIAGNOSIS — M25611 Stiffness of right shoulder, not elsewhere classified: Secondary | ICD-10-CM | POA: Diagnosis not present

## 2022-03-21 DIAGNOSIS — M6281 Muscle weakness (generalized): Secondary | ICD-10-CM

## 2022-03-21 DIAGNOSIS — R29898 Other symptoms and signs involving the musculoskeletal system: Secondary | ICD-10-CM

## 2022-03-21 DIAGNOSIS — M25511 Pain in right shoulder: Secondary | ICD-10-CM

## 2022-03-21 DIAGNOSIS — R293 Abnormal posture: Secondary | ICD-10-CM

## 2022-03-21 NOTE — Therapy (Signed)
[place] ?[facility] ?[place] ?[place], [place], [address] ?Phone: [phone]   Fax:  [phone] ? ?Physical Therapy Treatment / Recert ? ?Patient Details  ?Name: Meredith Mccormick ?MRN: [id number] ?Date of Birth: 1956-05-24 ?Referring Provider (PT): Justice Britain, MD ? ? ? ?Progress Note ? ?Reporting Period 03/05/2022 to 03/21/2022 ? ?See note below for Objective Data and Assessment of Progress/Goals.  ? ? ? ?Encounter Date: 03/21/2022 ? ? PT End of Session - 03/21/22 7078   ? ? Visit Number 18   ? Date for PT Re-Evaluation 05/02/22   ? Authorization Type UHC UMR   ? PT Start Time [phone]   Pt arrived late  ? PT Stop Time 0931   ? PT Time Calculation (min) 39 min   ? Activity Tolerance Patient tolerated treatment well   ? Behavior During Therapy [facility] for tasks assessed/performed   ? ?  ?  ? ?  ? ? ?Past Medical History:  ?Diagnosis Date  ? Depression   ? Hypertension   ? ? ?Past Surgical History:  ?Procedure Laterality Date  ? ABDOMINAL HYSTERECTOMY    ? CERVICAL SPINE SURGERY    ? ? ?There were no vitals filed for this visit. ? ? Subjective Assessment - 03/21/22 0855   ? ? 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 Pain has remained well controlled since the steriod injection at her last MD appt, now typically only experiencing some fatigue and post-exercise muscle soreness.   ? Pertinent History R RTC repair & biceps tenodesis 12/22/21   ? 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 Currently in Pain? No/denies   ? ?  ?  ? ?  ? ? ? ? ? [facility] PT Assessment - 03/21/22 0852   ? ?  ? Assessment  ? Medical Diagnosis R RCR + bicpes tenodesis   ? Referring Provider (PT) Justice Britain, MD   ? Onset Date/Surgical Date 12/22/21   ? Next MD Visit 04/04/22   RTW 03/23/22  ?  ? Prior Function  ? Level of Independence  Independent   ? Vocation Full time employment;Works at home   ? 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 Leisure reorganizing her house, would like to get into reading more and scrapbooking; no regular exercise   ?  ? AROM  ? Right Shoulder Flexion 99 Degrees   seated; 154? supine  ? Right Shoulder ABduction 95 Degrees   seated; 142? supine  ? Right Shoulder Internal Rotation 71 Degrees   at ~80 dg ABD supine  ? Right Shoulder External Rotation 57 Degrees   at ~80 dg ABD supine  ? ?  ?  ? ?  ? ? ? ? ? ? ? ? ? ? ? ? ? ? ? ? [place] Adult PT Treatment/Exercise - 03/21/22 0852   ? ?  ? Exercises  ? Exercises Shoulder   ?  ? Shoulder Exercises: Seated  ? Retraction Both;10 reps;AROM   2 sets  ? Retraction Limitations retraction + depression + slight shoulder ER   cues to avoid hiking/rolling shoulders  ? Flexion Right;AROM;AAROM;10 reps   ? Flexion Limitations mirror feedback to reduce shoulder hike with PT guiding end ROM   ? Abduction Right;AROM;10  reps   ? ABduction Limitations scaption - mirror feedback to reduce shoulder hike with PT guiding end ROM   ?  ? Shoulder Exercises: Isometric Strengthening  ? Flexion 5X5"   ? Flexion Limitations wall   ? Extension 5X5"   ? Extension Limitations wall   ? External Rotation 5X5"   ? Internal Rotation 5X5"   ? ABduction 5X5"   ? ABduction Limitations wall   ? ?  ?  ? ?  ? ? ? ? ? ? ? ? ? ? ? ? PT Short Term Goals - 01/18/22 1456   ? ?  ? PT SHORT TERM GOAL #1  ? Title Patient will be independent with initial HEP   ? Status Achieved   01/18/22  ? ?  ?  ? ?  ? ? ? ? PT Long Term Goals - 03/21/22 0859   ? ?  ? PT LONG TERM GOAL #1  ? Title Patient will be independent with ongoing/advanced HEP for self-management at home   ? Status Partially Met   ? Target Date 05/02/22   ?  ? PT LONG TERM GOAL #2  ? Title Patient to improve R shoulder AROM to WFL/WNL (flexion 160?, abduction 140? and ER 75?) without pain provocation   ? Status Partially Met   03/21/22 - met for ABD in  supine; limited anti-gravity control  ? Target Date 05/02/22   ?  ? PT LONG TERM GOAL #3  ? Title Patient will demonstrate improved R shoulder strength to >/= 4/5 to 4+/5 for functional UE use   ? Status On-going   ? Target Date 05/02/22   ?  ? PT LONG TERM GOAL #4  ? Title Patient to demonstrate R shoulder elevation to overhead shelf with 3-5# with good scapular mechanics and no evidence of compensations   ? Status On-going   ? Target Date 05/02/22   ?  ? PT LONG TERM GOAL #5  ? Title Patient to report ability to perform ADLs, household, and work-related tasks including computer and mouse use without limitation due to R shoulder pain, LOM or weakness   ? Status Partially Met   ? Target Date 05/02/22   ? ?  ?  ? ?  ? ? ? ? ? ? ? ? Plan - 03/21/22 0900   ? ? Clinical Impression Statement Kenyetta reports her R shoulder pain has been well controlled since the cortisone injection at her last MD visit, noting only mostly fatigue and some muscle soreness related to exercises, but which does not seem to linger. 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 R shoulder AROM near full ROM in gravity minimized position in supine but remains more limited against gravity with some evidence of compensatory shoulder hike still evident, hence overall R shoulder strength still 3-/5 or less. Daysha will benefit from continued skilled PT to restore functional R shoulder ROM and strength, therefore will recommend recert for additional 2x/wk for up to 6wks.   ? Personal Factors and Comorbidities Age;Comorbidity 3+;Fitness;Past/Current Experience;Time since onset of injury/illness/exacerbation   ? Comorbidities Cervical fusion, HTN, depression, anxiety, BMI > 30, 3 falls in past year   ? Examination-Activity Limitations Reach Overhead;Lift;Carry   ? Examination-Participation Restrictions Community Activity;Occupation;Meal Prep;[employer] Work   ? Rehab  Potential Good   ? PT Frequency 2x / week   ? PT Treatment/Interventions ADLs/Self Care Home Management;Cryotherapy;Electrical Stimulation;Moist  Heat;Ultrasound;Functional mobility training;Therapeutic activities;Therapeutic exercise;Neuromuscular re-education;Patient/family education;Manual techniques;Scar mobilization;Passive range of motion;Dry needling;Taping;Vasopneumatic Device;Joint Manipulations   ? PT Next Visit Plan Shoulder FOTO; Rehab per RCR + Biceps tenodesis protocol - post-op week #13 as of 03/23/22 (sx 12/22/21) - phase 4   ? PT Home Exercise Plan Access Code: [email or web address]   ? Consulted and Agree with Plan of Care Patient   ? ?  ?  ? ?  ? ? ?Patient will benefit from skilled therapeutic intervention in order to improve the following deficits and impairments:  Decreased activity tolerance, Decreased knowledge of precautions, Decreased mobility, Decreased range of motion, Decreased scar mobility, Decreased strength, Increased edema, Increased fascial restricitons, Increased muscle spasms, Impaired perceived functional ability, Impaired flexibility, Impaired UE functional use, Improper body mechanics, Postural dysfunction, Pain ? ?Visit Diagnosis: ?Stiffness of right shoulder, not elsewhere classified - Plan: PT plan of care cert/re-cert ? ?Acute pain of right shoulder - Plan: PT plan of care cert/re-cert ? ?Muscle weakness (generalized) - Plan: PT plan of care cert/re-cert ? ?Abnormal posture - Plan: PT plan of care cert/re-cert ? ?Other symptoms and signs involving the musculoskeletal system - Plan: PT plan of care cert/re-cert ? ? ? ? ?Problem List ?Patient Active Problem List  ? Diagnosis Date Noted  ? Depression   ? ? ?Percival Spanish, PT ?03/21/2022, 2:47 PM ? ?[place] ?[facility] ?[place] ?[place], [place], [address] ?Phone: [phone]   Fax:  [phone] ? ?Name: RAPHAEL ESPE ?MRN: [id number] ?Date of Birth: 1956/10/04 ? ? ? ?

## 2022-03-21 NOTE — Patient Instructions (Signed)
? ? ?  Access Code: [email or web address] ?URL: [email or web address] ?Date: 03/21/2022 ?Prepared by: Glenetta Hew ? ?Exercises ?- Supine Shoulder Flexion with Dowel (Mirrored)  - 2 x daily - 7 x weekly - 1-2 sets - 10 reps - 5 sec hold ?- Supine Shoulder External Rotation with Dowel (Mirrored)  - 2 x daily - 7 x weekly - 1-2 sets - 10 reps - 5 seconds hold ?- Sidelying Shoulder Flexion 15 Degrees (Mirrored)  - 1 x daily - 3-4 x weekly - 1-3 sets - 10 reps ?- Shoulder Flexion Wall Slide with Towel  - 1 x daily - 7 x weekly - 1-3 sets - 10 reps ?- Seated Cervical Sidebending Stretch  - 2 x daily - 7 x weekly - 1 sets - 3 reps - 30-60 sec hold ?- Sidelying Shoulder Abduction Palm Forward  - 1 x daily - 7 x weekly - 1-3 sets - 10 reps ?- Sidelying Shoulder External Rotation AROM  - 1 x daily - 7 x weekly - 1-3 sets - 10 reps ?- Standing Shoulder Flexion to 90 Degrees  - 1 x daily - 7 x weekly - 1-3 sets - 10 reps ?- Standing Bilateral Low Shoulder Row with Anchored Resistance  - 1 x daily - 3-4 x weekly - 3 sets - 10 reps ?- Shoulder Extension with Resistance  - 1 x daily - 3-4 x weekly - 3 sets - 10 reps ?- Isometric Shoulder Flexion at Wall  - 1 x daily - 7 x weekly - 2 sets - 5 reps - 5 sec hold ?- Isometric Shoulder Abduction at Wall  - 1 x daily - 7 x weekly - 2 sets - 5 reps - 5 sec hold ?- Isometric Shoulder Extension at Wall  - 1 x daily - 7 x weekly - 2 sets - 5 reps - 5 sec hold ?- Isometric Shoulder Internal Rotation  - 1 x daily - 7 x weekly - 2 sets - 5 reps - 5 sec hold ?- Isometric Shoulder External Rotation  - 1 x daily - 7 x weekly - 2 sets - 5 reps - 5 sec hold ? ?

## 2022-03-27 ENCOUNTER — Ambulatory Visit: Payer: Commercial Managed Care - PPO | Admitting: Physical Therapy

## 2022-03-27 ENCOUNTER — Encounter: Payer: Self-pay | Admitting: Physical Therapy

## 2022-03-27 DIAGNOSIS — R29898 Other symptoms and signs involving the musculoskeletal system: Secondary | ICD-10-CM

## 2022-03-27 DIAGNOSIS — M6281 Muscle weakness (generalized): Secondary | ICD-10-CM

## 2022-03-27 DIAGNOSIS — M25511 Pain in right shoulder: Secondary | ICD-10-CM

## 2022-03-27 DIAGNOSIS — M25611 Stiffness of right shoulder, not elsewhere classified: Secondary | ICD-10-CM | POA: Diagnosis not present

## 2022-03-27 DIAGNOSIS — R293 Abnormal posture: Secondary | ICD-10-CM

## 2022-03-27 NOTE — Therapy (Signed)
[place] ?[facility] ?[place] ?[place], [place], [address] ?Phone: [phone]   Fax:  [phone] ? ?Physical Therapy Treatment ? ?Patient Details  ?Name: Meredith Mccormick ?MRN: [id number] ?Date of Birth: [birth year] ?Referring Provider (PT): Justice Britain, MD ? ? ?Encounter Date: 03/27/2022 ? ? PT End of Session - 03/27/22 0929   ? ? Visit Number 19   ? Date for PT Re-Evaluation 05/02/22   ? Authorization Type UHC UMR   ? PT Start Time [phone]   ? PT Stop Time 1015   ? PT Time Calculation (min) 46 min   ? Activity Tolerance Patient tolerated treatment well   ? Behavior During Therapy [facility] for tasks assessed/performed   ? ?  ?  ? ?  ? ? ?Past Medical History:  ?Diagnosis Date  ? Depression   ? Hypertension   ? ? ?Past Surgical History:  ?Procedure Laterality Date  ? ABDOMINAL HYSTERECTOMY    ? CERVICAL SPINE SURGERY    ? ? ?There were no vitals filed for this visit. ? ? Subjective Assessment - 03/27/22 0932   ? ? Subjective Pt reports she started back to work as of Friday but most of her time has been spent get her programs back up and running. Pt reports she was denied her request for intermittent FMLA to cover her PT and MD appointments but states her supervisor will work with her to allow her to make up her time.   ? Pertinent History R RTC repair & biceps tenodesis 12/22/21   ? 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 Currently in Pain? No/denies   ? ?  ?  ? ?  ? ? ? ? ? [facility] PT Assessment - 03/27/22 0929   ? ?  ? Observation/Other Assessments  ? Focus on Therapeutic Outcomes (FOTO)  Shoulder = 67   ? ?  ?  ? ?  ? ? ? ? ? ? ? ? ? ? ? ? ? ? ? ? [place] Adult PT Treatment/Exercise - 03/27/22 0929   ? ?  ? Shoulder Exercises: Sidelying  ? External Rotation Right;15 reps;AROM   ? External Rotation Limitations cues for scap retraction   ? Flexion Right;10 reps;AROM   ? Flexion Limitations cues for scap  retraction   ? ABduction Right;15 reps;AROM   ? ABduction Limitations cues for scap retraction   ? Other Sidelying Exercises R shoulder horizontal ABD + scap retraction x 15   ?  ? Shoulder Exercises: Standing  ? Flexion Right;10 reps;AAROM   ? Flexion Limitations vertical cane AAROM while focusing on coordinated scapular motion   ? Extension Both;10 reps;Strengthening;Theraband   ? Theraband Level (Shoulder Extension) Level 3 (Green)   ? Extension Limitations emphasis on scap retraction & depression with shoulder extension only to neutral   ? Row Both;10 reps;Strengthening;Theraband   ? Theraband Level (Shoulder Row) Level 3 (Green)   ? Row Limitations emphasis on scap retraction & depression   ?  ? Shoulder Exercises: ROM/Strengthening  ? UBE (Upper Arm Bike) L2.0 x 6 min (3 min each fwd & back)   ? Wall Wash R shoulder flex in available range x 10 (VC & TC for scap retraction and rotation), scaption and ABD x 5 ea   ?  ? Wrist Exercises  ? Other wrist exercises R wrist extensor stretch 2 x 30 sec   ? ?  ?  ? ?  ? ? ? ? ? ? ? ? ? ? ? ?  PT Short Term Goals - 01/18/22 1456   ? ?  ? PT SHORT TERM GOAL #1  ? Title Patient will be independent with initial HEP   ? Status Achieved   01/18/22  ? ?  ?  ? ?  ? ? ? ? PT Long Term Goals - 03/21/22 0859   ? ?  ? PT LONG TERM GOAL #1  ? Title Patient will be independent with ongoing/advanced HEP for self-management at home   ? Status Partially Met   ? Target Date 05/02/22   ?  ? PT LONG TERM GOAL #2  ? Title Patient to improve R shoulder AROM to WFL/WNL (flexion 160?, abduction 140? and ER 75?) without pain provocation   ? Status Partially Met   03/21/22 - met for ABD in supine; limited anti-gravity control  ? Target Date 05/02/22   ?  ? PT LONG TERM GOAL #3  ? Title Patient will demonstrate improved R shoulder strength to >/= 4/5 to 4+/5 for functional UE use   ? Status On-going   ? Target Date 05/02/22   ?  ? PT LONG TERM GOAL #4  ? Title Patient to demonstrate R shoulder  elevation to overhead shelf with 3-5# with good scapular mechanics and no evidence of compensations   ? Status On-going   ? Target Date 05/02/22   ?  ? PT LONG TERM GOAL #5  ? Title Patient to report ability to perform ADLs, household, and work-related tasks including computer and mouse use without limitation due to R shoulder pain, LOM or weakness   ? Status Partially Met   ? Target Date 05/02/22   ? ?  ?  ? ?  ? ? ? ? ? ? ? ? Plan - 03/27/22 0937   ? ? Clinical Impression Statement Aerial reports she is back to work but still waiting on having her access restored to be fully back up to normal work activity. She denies R shoulder pain today but does note some increased pain near her elbow and TTP identified in wrist extensor group which seems consistent with lateral epicondylitis, likely from resuming use of computer and mouse. Instructed pt in wrist extensor stretch and self-STM +/- ice massage to address soreness. R shoulder AROM remains limited in part due to continued scapular dyskinesis, therefore focused on coordination of scapular motion during R shoulder A/AAROM through multiple planes as well as review of scapular strengthening. Pt able to move through increased ROM when able to better engage/coordinate scapular movement.   ? Comorbidities Cervical fusion, HTN, depression, anxiety, BMI > 30, 3 falls in past year   ? Rehab Potential Good   ? PT Frequency 2x / week   ? PT Duration 6 weeks   ? PT Treatment/Interventions ADLs/Self Care Home Management;Cryotherapy;Electrical Stimulation;Moist Heat;Ultrasound;Functional mobility training;Therapeutic activities;Therapeutic exercise;Neuromuscular re-education;Patient/family education;Manual techniques;Scar mobilization;Passive range of motion;Dry needling;Taping;Vasopneumatic Device;Joint Manipulations   ? PT Next Visit Plan Rehab per RCR + Biceps tenodesis protocol - post-op week #13 as of 03/23/22 (sx 12/22/21) - phase 4   ? PT Home Exercise Plan Access Code:  [email or web address]   ? Consulted and Agree with Plan of Care Patient   ? ?  ?  ? ?  ? ? ?Patient will benefit from skilled therapeutic intervention in order to improve the following deficits and impairments:  Decreased activity tolerance, Decreased knowledge of precautions, Decreased mobility, Decreased range of motion, Decreased scar mobility, Decreased strength, Increased edema, Increased fascial restricitons, Increased muscle spasms, Impaired  perceived functional ability, Impaired flexibility, Impaired UE functional use, Improper body mechanics, Postural dysfunction, Pain ? ?Visit Diagnosis: ?Stiffness of right shoulder, not elsewhere classified ? ?Acute pain of right shoulder ? ?Muscle weakness (generalized) ? ?Abnormal posture ? ?Other symptoms and signs involving the musculoskeletal system ? ? ? ? ?Problem List ?Patient Active Problem List  ? Diagnosis Date Noted  ? Depression   ? ? ?Percival Spanish, PT ?03/27/2022, 12:34 PM ? ?[place] ?[facility] ?[place] ?[place], [place], [address] ?Phone: [phone]   Fax:  [phone] ? ?Name: SHRON OZER ?MRN: [id number] ?Date of Birth: [birth year] ? ? ? ?

## 2022-03-29 ENCOUNTER — Ambulatory Visit: Payer: Commercial Managed Care - PPO

## 2022-03-29 DIAGNOSIS — M6281 Muscle weakness (generalized): Secondary | ICD-10-CM

## 2022-03-29 DIAGNOSIS — R293 Abnormal posture: Secondary | ICD-10-CM

## 2022-03-29 DIAGNOSIS — M25511 Pain in right shoulder: Secondary | ICD-10-CM

## 2022-03-29 DIAGNOSIS — M25611 Stiffness of right shoulder, not elsewhere classified: Secondary | ICD-10-CM

## 2022-03-29 DIAGNOSIS — R29898 Other symptoms and signs involving the musculoskeletal system: Secondary | ICD-10-CM

## 2022-03-29 NOTE — Therapy (Signed)
[place] ?[facility] ?[place] ?[place], [place], [address] ?Phone: [phone]   Fax:  [phone] ? ?Physical Therapy Treatment ? ?Patient Details  ?Name: Meredith Mccormick ?MRN: [id number] ?Date of Birth: 1956-11-05 ?Referring Provider (PT): Justice Britain, MD ? ? ?Encounter Date: 03/29/2022 ? ? PT End of Session - 03/29/22 1151   ? ? Visit Number 20   ? Date for PT Re-Evaluation 05/02/22   ? Authorization Type UHC UMR   ? PT Start Time 1103   ? PT Stop Time 1062   ? PT Time Calculation (min) 40 min   ? Activity Tolerance Patient tolerated treatment well   ? Behavior During Therapy [facility] for tasks assessed/performed   ? ?  ?  ? ?  ? ? ?Past Medical History:  ?Diagnosis Date  ? Depression   ? Hypertension   ? ? ?Past Surgical History:  ?Procedure Laterality Date  ? ABDOMINAL HYSTERECTOMY    ? CERVICAL SPINE SURGERY    ? ? ?There were no vitals filed for this visit. ? ? Subjective Assessment - 03/29/22 1106   ? ? Subjective 'i have to find more time to do HEP now that I am working, I do one set before work and the other set after work.'   ? Pertinent History R RTC repair & biceps tenodesis 12/22/21   ? 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 Currently in Pain? No/denies   ? ?  ?  ? ?  ? ? ? ? ? ? ? ? ? ? ? ? ? ? ? ? ? ? ? ? [place] Adult PT Treatment/Exercise - 03/29/22 0001   ? ?  ? Shoulder Exercises: Supine  ? Other Supine Exercises chest press with 4lb cuff weight wrapped around cane x 20   ? Other Supine Exercises R CW/CCW circles with 1lb weight x 20   ?  ? Shoulder Exercises: Standing  ? Other Standing Exercises cabinet reaches  R UE x 10 to 2nd shelf; B UE flexion with 1lb weights 2x10; R UE abduction reach to cabinet 2x10   ? Other Standing Exercises R UE CW/CCW circles x 20 each   ?  ? Shoulder Exercises: ROM/Strengthening  ? UBE (Upper Arm Bike) L2.5 x 6 min (3 min each fwd & back)   ? Other  ROM/Strengthening Exercises placing 4lb weight on 2nd shelf with B UE x 10   ? ?  ?  ? ?  ? ? ? ? ? ? ? ? ? ? ? ? PT Short Term Goals - 01/18/22 1456   ? ?  ? PT SHORT TERM GOAL #1  ? Title Patient will be independent with initial HEP   ? Status Achieved   01/18/22  ? ?  ?  ? ?  ? ? ? ? PT Long Term Goals - 03/21/22 0859   ? ?  ? PT LONG TERM GOAL #1  ? Title Patient will be independent with ongoing/advanced HEP for self-management at home   ? Status Partially Met   ? Target Date 05/02/22   ?  ? PT LONG TERM GOAL #2  ? Title Patient to improve R shoulder AROM to WFL/WNL (flexion 160?, abduction 140? and ER 75?) without pain provocation   ? Status Partially Met   03/21/22 - met for ABD in supine; limited anti-gravity control  ? Target Date  05/02/22   ?  ? PT LONG TERM GOAL #3  ? Title Patient will demonstrate improved R shoulder strength to >/= 4/5 to 4+/5 for functional UE use   ? Status On-going   ? Target Date 05/02/22   ?  ? PT LONG TERM GOAL #4  ? Title Patient to demonstrate R shoulder elevation to overhead shelf with 3-5# with good scapular mechanics and no evidence of compensations   ? Status On-going   ? Target Date 05/02/22   ?  ? PT LONG TERM GOAL #5  ? Title Patient to report ability to perform ADLs, household, and work-related tasks including computer and mouse use without limitation due to R shoulder pain, LOM or weakness   ? Status Partially Met   ? Target Date 05/02/22   ? ?  ?  ? ?  ? ? ? ? ? ? ? ? Plan - 03/29/22 1151   ? ? Clinical Impression Statement Continued working on [facility] reaching and added DB weights to progress exercises. Pt noted resolution of wrist pain last session. Pt demonstrated mild pain with standing CW/CCW circles but with cues to drop shoulder below 90 deg flexion pain subsided. Also had some mild pain with supine chest press but when weight was decreased pain subsided. Today she did show more ease with reaching R arm OH with better scapular mechanics. Pt notes having an MD appointment  on this upcoming Wednesday, so objective measures would be needed next visit.   ? Personal Factors and Comorbidities Age;Comorbidity 3+;Fitness;Past/Current Experience;Time since onset of injury/illness/exacerbation   ? Comorbidities Cervical fusion, HTN, depression, anxiety, BMI > 30, 3 falls in past year   ? PT Frequency 2x / week   ? PT Duration 6 weeks   ? PT Treatment/Interventions ADLs/Self Care Home Management;Cryotherapy;Electrical Stimulation;Moist Heat;Ultrasound;Functional mobility training;Therapeutic activities;Therapeutic exercise;Neuromuscular re-education;Patient/family education;Manual techniques;Scar mobilization;Passive range of motion;Dry needling;Taping;Vasopneumatic Device;Joint Manipulations   ? PT Next Visit Plan Rehab per RCR + Biceps tenodesis protocol - post-op week #14 as of 03/30/22 (sx 12/22/21) - phase 4   ? PT Home Exercise Plan Access Code: [email or web address]   ? Consulted and Agree with Plan of Care Patient   ? ?  ?  ? ?  ? ? ?Patient will benefit from skilled therapeutic intervention in order to improve the following deficits and impairments:  Decreased activity tolerance, Decreased knowledge of precautions, Decreased mobility, Decreased range of motion, Decreased scar mobility, Decreased strength, Increased edema, Increased fascial restricitons, Increased muscle spasms, Impaired perceived functional ability, Impaired flexibility, Impaired UE functional use, Improper body mechanics, Postural dysfunction, Pain ? ?Visit Diagnosis: ?Stiffness of right shoulder, not elsewhere classified ? ?Acute pain of right shoulder ? ?Muscle weakness (generalized) ? ?Abnormal posture ? ?Other symptoms and signs involving the musculoskeletal system ? ? ? ? ?Problem List ?Patient Active Problem List  ? Diagnosis Date Noted  ? Depression   ? ? ?Artist Pais, PTA ?03/29/2022, 11:59 AM ? ?[place] ?[facility] ?[place] ?[place], [place], [address] ?Phone:  [phone]   Fax:  [phone] ? ?Name: Meredith Mccormick ?MRN: [id number] ?Date of Birth: April 11, 1956 ? ? ? ?

## 2022-04-02 ENCOUNTER — Ambulatory Visit: Payer: Commercial Managed Care - PPO | Admitting: Physical Therapy

## 2022-04-02 ENCOUNTER — Encounter: Payer: Self-pay | Admitting: Physical Therapy

## 2022-04-02 DIAGNOSIS — M25611 Stiffness of right shoulder, not elsewhere classified: Secondary | ICD-10-CM | POA: Diagnosis not present

## 2022-04-02 DIAGNOSIS — R29898 Other symptoms and signs involving the musculoskeletal system: Secondary | ICD-10-CM

## 2022-04-02 DIAGNOSIS — R293 Abnormal posture: Secondary | ICD-10-CM

## 2022-04-02 DIAGNOSIS — M25511 Pain in right shoulder: Secondary | ICD-10-CM

## 2022-04-02 DIAGNOSIS — M6281 Muscle weakness (generalized): Secondary | ICD-10-CM

## 2022-04-02 NOTE — Therapy (Addendum)
[facility] ?[facility] ?[place] [facility]ormick ?[place], [place], [address] ?Phone: [phone]   Fax:  [phone] ? ?Physical Therapy Treatment / Progress Note ? ?Patient Details  ?Name: Meredith Mccormick ?MRN: [id number] ?Date of Birth: 1956/09/05 ?Referring Provider (PT): Meredith Britain, MD ? ?Progress Note ? ?Reporting Period 03/21/2022 to 04/02/2022 ? ?See note below for Objective Data and Assessment of Progress/Goals.  ? ? ? ?Encounter Date: 04/02/2022 ? ? PT End of Session - 04/02/22 0933   ? ? Visit Number 21   ? Date for PT Re-Evaluation 05/02/22   ? Authorization Type UHC UMR   ? PT Start Time [phone]   ? PT Stop Time 1017   ? PT Time Calculation (min) 44 min   ? Activity Tolerance Patient tolerated treatment well   ? Behavior During Therapy [facility] for tasks assessed/performed   ? ?  ?  ? ?  ? ? ?Past Medical History:  ?Diagnosis Date  ? Depression   ? Hypertension   ? ? ?Past Surgical History:  ?Procedure Laterality Date  ? ABDOMINAL HYSTERECTOMY    ? CERVICAL SPINE SURGERY    ? ? ?There were no vitals filed for this visit. ? ? Subjective Assessment - 04/02/22 0936   ? ? Subjective No pain yet today "but I haven't done any exercises yet."   ? Pertinent History R RTC repair & biceps tenodesis 12/22/21   ? 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 Currently in Pain? No/denies   ? ?  ?  ? ?  ? ? ? ? ? [facility] PT Assessment - 04/02/22 0933   ? ?  ? Assessment  ? Medical Diagnosis R RCR + bicpes tenodesis   ? Referring Provider (PT) Meredith Britain, MD   ? Onset Date/Surgical Date 12/22/21   ? Next MD Visit 04/04/22   ?  ? Observation/Other Assessments  ? Focus on Therapeutic Outcomes (FOTO)  Shoulder = 67   as of 03/27/22  ?  ? AROM  ? Right Shoulder Flexion 117 Degrees   seated; 164? supine  ? Right Shoulder ABduction 99 Degrees   seated; 151? supine  ? Right Shoulder Internal Rotation 80 Degrees   at ~80 dg ABD supine  ? Right  Shoulder External Rotation 72 Degrees   at ~80 dg ABD supine  ? ?  ?  ? ?  ? ? ? ? ? ? ? ? ? ? ? ? ? ? ? ? [place] Adult PT Treatment/Exercise - 04/02/22 0933   ? ?  ? Shoulder Exercises: Standing  ? Flexion Right;10 reps;AAROM   ? Flexion Limitations wand - VC & TC with mirror feedback to avoid shoulder hike and promote scapular motion   ? Extension Both;10 reps;AAROM   ? Extension Limitations wand   ?  ? Shoulder Exercises: ROM/Strengthening  ? UBE (Upper Arm Bike) L2.5 x 6 min (3 min each fwd & back)   ? ?  ?  ? ?  ? ? ? ? ? ? ? ? ? ? ? ? PT Short Term Goals - 01/18/22 1456   ? ?  ? PT SHORT TERM GOAL #1  ? Title Patient will be independent with initial HEP   ? Status Achieved   01/18/22  ? ?  ?  ? ?  ? ? ? ? PT Long Term Goals - 04/02/22 0938   ? ?  ?  PT LONG TERM GOAL #1  ? Title Patient will be independent with ongoing/advanced HEP for self-management at home   ? Status Partially Met   ? Target Date 05/02/22   ?  ? PT LONG TERM GOAL #2  ? Title Patient to improve R shoulder AROM to WFL/WNL (flexion 160?, abduction 140? and ER 75?) without pain provocation   ? Status Partially Met   03/21/22 - met for ABD in supine; limited anti-gravity control  ? Target Date 05/02/22   ?  ? PT LONG TERM GOAL #3  ? Title Patient will demonstrate improved R shoulder strength to >/= 4/5 to 4+/5 for functional UE use   ? Status On-going   ? Target Date 05/02/22   ?  ? PT LONG TERM GOAL #4  ? Title Patient to demonstrate R shoulder elevation to overhead shelf with 3-5# with good scapular mechanics and no evidence of compensations   ? Status On-going   ? Target Date 05/02/22   ?  ? PT LONG TERM GOAL #5  ? Title Patient to report ability to perform ADLs, household, and work-related tasks including computer and mouse use without limitation due to R shoulder pain, LOM or weakness   ? Status Partially Met   04/02/22 - pt reports returning to work seems to be going well; only difficulty remains with reaching overhead  ? Target Date 05/02/22    ? ?  ?  ? ?  ? ? ? ? ? ? ? ? Plan - 04/02/22 0940   ? ? Clinical Impression Statement Meredith Mccormick is progressing with PT with near full AROM in gravity minimized positions but remains limited with movements against gravity with persistent substitution evident in R shoulder hike during overhead elevation in flexion or abduction. Some increased muscle tension also limiting ROM - addressed with manual therapy today resulting in further gains in ROM. She has returned to work and denies any limitations due to her R shoulder but remains limited with overhead tasks and ADLs w/in her home. Meredith Mccormick will continue to benefit from skilled PT to restore full functional R shoulder ROM and strength to allow full performance of daily tasks.  ? Comorbidities Cervical fusion, HTN, depression, anxiety, BMI > 30, 3 falls in past year   ? Rehab Potential Good   ? PT Frequency 2x / week   ? PT Duration 6 weeks   ? PT Treatment/Interventions ADLs/Self Care Home Management;Cryotherapy;Electrical Stimulation;Moist Heat;Ultrasound;Functional mobility training;Therapeutic activities;Therapeutic exercise;Neuromuscular re-education;Patient/family education;Manual techniques;Scar mobilization;Passive range of motion;Dry needling;Taping;Vasopneumatic Device;Joint Manipulations   ? PT Next Visit Plan Rehab per RCR + Biceps tenodesis protocol - post-op week #14 as of 03/30/22 (sx 12/22/21) - phase 4   ? PT Home Exercise Plan Access Code: [email or web address]   ? Consulted and Agree with Plan of Care Patient   ? ?  ?  ? ?  ? ? ?Patient will benefit from skilled therapeutic intervention in order to improve the following deficits and impairments:  Decreased activity tolerance, Decreased knowledge of precautions, Decreased mobility, Decreased range of motion, Decreased scar mobility, Decreased strength, Increased edema, Increased fascial restricitons, Increased muscle spasms, Impaired perceived functional ability, Impaired flexibility, Impaired UE functional use,  Improper body mechanics, Postural dysfunction, Pain ? ?Visit Diagnosis: ?Stiffness of right shoulder, not elsewhere classified ? ?Acute pain of right shoulder ? ?Muscle weakness (generalized) ? ?Abnormal posture ? ?Other symptoms and signs involving the musculoskeletal system ? ? ? ? ?Problem List ?Patient Active Problem List  ? Diagnosis Date Noted  ?  Depression   ? ? ?Percival Spanish, PT ?04/02/2022, 12:52 PM ? ?[place] ?[facility] ?[place] [address] ?[place], [place], [address] ?Phone: [phone]   Fax:  [phone] ? ?Name: Meredith Mccormick ?MRN: [id number] ?Date of Birth: Apr 28, 1956 ? ? ? ?

## 2022-04-05 ENCOUNTER — Ambulatory Visit: Payer: Commercial Managed Care - PPO

## 2022-04-05 DIAGNOSIS — R293 Abnormal posture: Secondary | ICD-10-CM

## 2022-04-05 DIAGNOSIS — M25611 Stiffness of right shoulder, not elsewhere classified: Secondary | ICD-10-CM | POA: Diagnosis not present

## 2022-04-05 DIAGNOSIS — R29898 Other symptoms and signs involving the musculoskeletal system: Secondary | ICD-10-CM

## 2022-04-05 DIAGNOSIS — M6281 Muscle weakness (generalized): Secondary | ICD-10-CM

## 2022-04-05 DIAGNOSIS — M25511 Pain in right shoulder: Secondary | ICD-10-CM

## 2022-04-05 NOTE — Therapy (Signed)
[place] ?[facility] ?[place] ?[place], [place], [address] ?Phone: [phone]   Fax:  [phone] ? ?Physical Therapy Treatment ? ?Patient Details  ?Name: Meredith Mccormick ?MRN: [id number] ?Date of Birth: 10-03-1956 ?Referring Provider (PT): Justice Britain, MD ? ? ?Encounter Date: 04/05/2022 ? ? PT End of Session - 04/05/22 0930   ? ? Visit Number 22   ? Date for PT Re-Evaluation 05/02/22   ? Authorization Type UHC UMR   ? PT Start Time 0848   ? PT Stop Time [phone]   ? PT Time Calculation (min) 40 min   ? Activity Tolerance Patient tolerated treatment well   ? Behavior During Therapy [facility] for tasks assessed/performed   ? ?  ?  ? ?  ? ? ?Past Medical History:  ?Diagnosis Date  ? Depression   ? Hypertension   ? ? ?Past Surgical History:  ?Procedure Laterality Date  ? ABDOMINAL HYSTERECTOMY    ? CERVICAL SPINE SURGERY    ? ? ?There were no vitals filed for this visit. ? ? Subjective Assessment - 04/05/22 0850   ? ? 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   ? Pertinent History R RTC repair & biceps tenodesis 12/22/21   ? 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 Currently in Pain? No/denies   ? ?  ?  ? ?  ? ? ? ? ? ? ? ? ? ? ? ? ? ? ? ? ? ? ? ? [place] Adult PT Treatment/Exercise - 04/05/22 0001   ? ?  ? Shoulder Exercises: Seated  ? Flexion AAROM;Right;15 reps   ? Flexion Limitations with green pball and 5 sec hold   ?  ? Shoulder Exercises: Sidelying  ? External Rotation Strengthening;Right;20 reps;Weights   ? External Rotation Weight (lbs) 2   ?  ? Shoulder Exercises: Standing  ? Flexion Strengthening;Both;20 reps;Weights   ? Shoulder Flexion Weight (lbs) 4   ?  ? Shoulder Exercises: ROM/Strengthening  ? UBE (Upper Arm Bike) L3.0 x 6 min (3 min each fwd & back)   ? Wall Wash into flexion with 1# x 10   tried abd with  and w/o weight but limited by pain  ? Other ROM/Strengthening Exercises stacking 6 cones onto 2nd shelf on cabinet 5x up and back down   ? ?  ?  ? ?  ? ? ? ? ? ? ? ? ? ? ? ? PT Short Term Goals - 01/18/22 1456   ? ?  ? PT SHORT TERM GOAL #1  ? Title Patient will be independent with initial HEP   ? Status Achieved   01/18/22  ? ?  ?  ? ?  ? ? ? ? PT Long Term Goals - 04/05/22 0929   ? ?  ? PT LONG TERM GOAL #1  ? Title Patient will be independent with ongoing/advanced HEP for self-management at home   ? Status Partially Met   ? Target Date 05/02/22   ?  ? PT LONG TERM GOAL #2  ? Title Patient to improve R shoulder AROM to WFL/WNL (flexion 160?, abduction 140? and ER 75?) without pain provocation   ? Status Partially Met   03/21/22 - met for ABD in supine; limited anti-gravity control  ?  Target Date 05/02/22   ?  ? PT LONG TERM GOAL #3  ? Title Patient will demonstrate improved R shoulder strength to >/= 4/5 to 4+/5 for functional UE use   ? Status On-going   ? Target Date 05/02/22   ?  ? PT LONG TERM GOAL #4  ? Title Patient to demonstrate R shoulder elevation to overhead shelf with 3-5# with good scapular mechanics and no evidence of compensations   ? Status On-going   able to place 4lb weight onto OH shelf with B UE; good scapular mechanics  ? Target Date 05/02/22   ?  ? PT LONG TERM GOAL #5  ? Title Patient to report ability to perform ADLs, household, and work-related tasks including computer and mouse use without limitation due to R shoulder pain, LOM or weakness   ? Status Partially Met   04/02/22 - pt reports returning to work seems to be going well; only difficulty remains with reaching overhead  ? Target Date 05/02/22   ? ?  ?  ? ?  ? ? ? ? ? ? ? ? Plan - 04/05/22 0930   ? ? Clinical Impression Statement Pt responded well to treatment, other than noting some R UE fatigue as we worked a lot on [place] reaching. Pt She definitely is showing more ease with OH reaching with better scapulohumeral mechanics, only  occasionally required tactile cues to depress shoulder when reaching - making progress toward LTG #4.   ? Personal Factors and Comorbidities Age;Comorbidity 3+;Fitness;Past/Current Experience;Time since onset of injury/illness/exacerbation   ? Comorbidities Cervical fusion, HTN, depression, anxiety, BMI > 30, 3 falls in past year   ? PT Frequency 2x / week   ? PT Duration 6 weeks   ? PT Treatment/Interventions ADLs/Self Care Home Management;Cryotherapy;Electrical Stimulation;Moist Heat;Ultrasound;Functional mobility training;Therapeutic activities;Therapeutic exercise;Neuromuscular re-education;Patient/family education;Manual techniques;Scar mobilization;Passive range of motion;Dry needling;Taping;Vasopneumatic Device;Joint Manipulations   ? PT Next Visit Plan Rehab per RCR + Biceps tenodesis protocol - post-op week #15 as of 04/06/22 (sx 12/22/21) - phase 4   ? PT Home Exercise Plan Access Code: [email or web address]   ? Consulted and Agree with Plan of Care Patient   ? ?  ?  ? ?  ? ? ?Patient will benefit from skilled therapeutic intervention in order to improve the following deficits and impairments:  Decreased activity tolerance, Decreased knowledge of precautions, Decreased mobility, Decreased range of motion, Decreased scar mobility, Decreased strength, Increased edema, Increased fascial restricitons, Increased muscle spasms, Impaired perceived functional ability, Impaired flexibility, Impaired UE functional use, Improper body mechanics, Postural dysfunction, Pain ? ?Visit Diagnosis: ?Stiffness of right shoulder, not elsewhere classified ? ?Acute pain of right shoulder ? ?Muscle weakness (generalized) ? ?Abnormal posture ? ?Other symptoms and signs involving the musculoskeletal system ? ? ? ? ?Problem List ?Patient Active Problem List  ? Diagnosis Date Noted  ? Depression   ? ? ?Artist Pais, PTA ?04/05/2022, 10:26 AM ? ?[place] ?[facility] ?[address] ?[place], [place], [address] ?Phone: [phone]   Fax:  [phone] ? ?Name: KAEDANCE MAGOS ?MRN: [id number] ?Date of Birth: 22-May-1956 ? ? ? ?

## 2022-04-10 ENCOUNTER — Ambulatory Visit: Payer: Commercial Managed Care - PPO

## 2022-04-10 DIAGNOSIS — M25511 Pain in right shoulder: Secondary | ICD-10-CM

## 2022-04-10 DIAGNOSIS — M25611 Stiffness of right shoulder, not elsewhere classified: Secondary | ICD-10-CM

## 2022-04-10 DIAGNOSIS — M6281 Muscle weakness (generalized): Secondary | ICD-10-CM

## 2022-04-10 DIAGNOSIS — R293 Abnormal posture: Secondary | ICD-10-CM

## 2022-04-10 DIAGNOSIS — R29898 Other symptoms and signs involving the musculoskeletal system: Secondary | ICD-10-CM

## 2022-04-10 NOTE — Therapy (Signed)
[place] ?[facility] ?[address] ?[place], [place], [address] ?Phone: [phone]   Fax:  [phone] ? ?Physical Therapy Treatment ? ?Patient Details  ?Name: Meredith Mccormick ?MRN: [id number] ?Date of Birth: 12-02-1956 ?Referring Provider (PT): Justice Britain, MD ? ? ?Encounter Date: 04/10/2022 ? ? PT End of Session - 04/10/22 0845   ? ? Visit Number 23   ? Date for PT Re-Evaluation 05/02/22   ? Authorization Type UHC UMR   ? PT Start Time [phone]   ? PT Stop Time 0845   ? PT Time Calculation (min) 42 min   ? Activity Tolerance Patient tolerated treatment well   ? Behavior During Therapy [facility] for tasks assessed/performed   ? ?  ?  ? ?  ? ? ?Past Medical History:  ?Diagnosis Date  ? Depression   ? Hypertension   ? ? ?Past Surgical History:  ?Procedure Laterality Date  ? ABDOMINAL HYSTERECTOMY    ? CERVICAL SPINE SURGERY    ? ? ?There were no vitals filed for this visit. ? ? Subjective Assessment - 04/10/22 0807   ? ? Subjective 'I mowed the yard and did some weed eating.'   ? Pertinent History R RTC repair & biceps tenodesis 12/22/21   ? 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 Currently in Pain? No/denies   ? ?  ?  ? ?  ? ? ? ? ? ? ? ? ? ? ? ? ? ? ? ? ? ? ? ? [place] Adult PT Treatment/Exercise - 04/10/22 0001   ? ?  ? Shoulder Exercises: Standing  ? Other Standing Exercises R UE finger ladder into flexion and abduction x 10   ? Other Standing Exercises cabinet reaches B UE 4lb x 10   ?  ? Shoulder Exercises: Pulleys  ? Flexion 3 minutes   ? Scaption 3 minutes   ?  ? Manual Therapy  ? Manual Therapy Soft tissue mobilization   ? Soft tissue mobilization STM to R infraspinatus, long head of biceps, anterior deltoid   ? ?  ?  ? ?  ? ? ? ? ? ? ? ? ? ? ? ? PT Short Term Goals - 01/18/22 1456   ? ?  ? PT SHORT TERM GOAL #1  ? Title Patient will be independent with initial HEP   ? Status Achieved   01/18/22  ? ?  ?  ? ?   ? ? ? ? PT Long Term Goals - 04/05/22 0929   ? ?  ? PT LONG TERM GOAL #1  ? Title Patient will be independent with ongoing/advanced HEP for self-management at home   ? Status Partially Met   ? Target Date 05/02/22   ?  ? PT LONG TERM GOAL #2  ? Title Patient to improve R shoulder AROM to WFL/WNL (flexion 160?, abduction 140? and ER 75?) without pain provocation   ? Status Partially Met   03/21/22 - met for ABD in supine; limited anti-gravity control  ? Target Date 05/02/22   ?  ? PT LONG TERM GOAL #3  ? Title Patient will demonstrate improved R shoulder strength to >/= 4/5 to 4+/5 for functional UE use   ? Status On-going   ? Target Date 05/02/22   ?  ? PT LONG TERM GOAL #4  ? Title Patient to demonstrate R shoulder elevation to  overhead shelf with 3-5# with good scapular mechanics and no evidence of compensations   ? Status On-going   able to place 4lb weight onto OH shelf with B UE; good scapular mechanics  ? Target Date 05/02/22   ?  ? PT LONG TERM GOAL #5  ? Title Patient to report ability to perform ADLs, household, and work-related tasks including computer and mouse use without limitation due to R shoulder pain, LOM or weakness   ? Status Partially Met   04/02/22 - pt reports returning to work seems to be going well; only difficulty remains with reaching overhead  ? Target Date 05/02/22   ? ?  ?  ? ?  ? ? ? ? ? ? ? ? Plan - 04/10/22 0846   ? ? Clinical Impression Statement Pt noted more achiness in her R shoulder today from mowing grass, weed whacking, and putting up curtains over the weekend. Started session with STM to the R shoulder to reduce tightness and improve recovery in the R shoulder muscles. Afterwards worked on OH movements to restore functional ROM. Pt demonstrated good scapular mechanics with less UT compensation with OH reaching. She did note more difficulty with behind back movements so will plan to incorporate more exercises to help with this next visit.   ? Personal Factors and Comorbidities  Age;Comorbidity 3+;Fitness;Past/Current Experience;Time since onset of injury/illness/exacerbation   ? Comorbidities Cervical fusion, HTN, depression, anxiety, BMI > 30, 3 falls in past year   ? PT Frequency 2x / week   ? PT Duration 6 weeks   ? PT Treatment/Interventions ADLs/Self Care Home Management;Cryotherapy;Electrical Stimulation;Moist Heat;Ultrasound;Functional mobility training;Therapeutic activities;Therapeutic exercise;Neuromuscular re-education;Patient/family education;Manual techniques;Scar mobilization;Passive range of motion;Dry needling;Taping;Vasopneumatic Device;Joint Manipulations   ? PT Next Visit Plan Rehab per RCR + Biceps tenodesis protocol - post-op week #15 as of 04/06/22 (sx 12/22/21) - phase 4   ? PT Home Exercise Plan Access Code: [email or web address]   ? Consulted and Agree with Plan of Care Patient   ? ?  ?  ? ?  ? ? ?Patient will benefit from skilled therapeutic intervention in order to improve the following deficits and impairments:  Decreased activity tolerance, Decreased knowledge of precautions, Decreased mobility, Decreased range of motion, Decreased scar mobility, Decreased strength, Increased edema, Increased fascial restricitons, Increased muscle spasms, Impaired perceived functional ability, Impaired flexibility, Impaired UE functional use, Improper body mechanics, Postural dysfunction, Pain ? ?Visit Diagnosis: ?Stiffness of right shoulder, not elsewhere classified ? ?Acute pain of right shoulder ? ?Muscle weakness (generalized) ? ?Abnormal posture ? ?Other symptoms and signs involving the musculoskeletal system ? ? ? ? ?Problem List ?Patient Active Problem List  ? Diagnosis Date Noted  ? Depression   ? ? ?Meredith Mccormick, Meredith Mccormick ?04/10/2022, 9:46 AM ? ?[place] ?[facility] ?[address] ?[place], [place], [address] ?Phone: [phone]   Fax:  [phone] ? ?Name: Meredith Mccormick ?MRN: [id number] ?Date of Birth: 07-22-1956 ? ? ? ?

## 2022-04-12 ENCOUNTER — Ambulatory Visit: Payer: Commercial Managed Care - PPO

## 2022-04-12 DIAGNOSIS — R29898 Other symptoms and signs involving the musculoskeletal system: Secondary | ICD-10-CM

## 2022-04-12 DIAGNOSIS — M6281 Muscle weakness (generalized): Secondary | ICD-10-CM

## 2022-04-12 DIAGNOSIS — R293 Abnormal posture: Secondary | ICD-10-CM

## 2022-04-12 DIAGNOSIS — M25511 Pain in right shoulder: Secondary | ICD-10-CM

## 2022-04-12 DIAGNOSIS — M25611 Stiffness of right shoulder, not elsewhere classified: Secondary | ICD-10-CM | POA: Diagnosis not present

## 2022-04-12 NOTE — Therapy (Signed)
[place] ?[facility] ?[address] ?[place], [place], [address] ?Phone: [phone]   Fax:  [phone] ? ?Physical Therapy Treatment ? ?Patient Details  ?Name: Meredith Mccormick ?MRN: [id number] ?Date of Birth: [birth year] ?Referring Provider (PT): Justice Britain, MD ? ? ?Encounter Date: 04/12/2022 ? ? PT End of Session - 04/12/22 1155   ? ? Visit Number 24   ? Date for PT Re-Evaluation 05/02/22   ? Authorization Type UHC UMR   ? PT Start Time 1102   ? PT Stop Time 1146   ? PT Time Calculation (min) 44 min   ? Activity Tolerance Patient tolerated treatment well   ? Behavior During Therapy [facility] for tasks assessed/performed   ? ?  ?  ? ?  ? ? ?Past Medical History:  ?Diagnosis Date  ? Depression   ? Hypertension   ? ? ?Past Surgical History:  ?Procedure Laterality Date  ? ABDOMINAL HYSTERECTOMY    ? CERVICAL SPINE SURGERY    ? ? ?There were no vitals filed for this visit. ? ? Subjective Assessment - 04/12/22 1105   ? ? Subjective Pt reports soreness along anterior shoulder today.   ? Pertinent History R RTC repair & biceps tenodesis 12/22/21   ? 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 Currently in Pain? No/denies   ? ?  ?  ? ?  ? ? ? ? ? [facility] PT Assessment - 04/12/22 0001   ? ?  ? AROM  ? Right Shoulder Flexion 139 Degrees   with wall wash  ? Right Shoulder ABduction 128 Degrees   with wall wash (scaption)  ? Right Shoulder Internal Rotation --   FIR to L4  ? Right Shoulder External Rotation 72 Degrees   FER to nape of neck  ? ?  ?  ? ?  ? ? ? ? ? ? ? ? ? ? ? ? ? ? ? ? [place] Adult PT Treatment/Exercise - 04/12/22 0001   ? ?  ? Shoulder Exercises: Standing  ? External Rotation Strengthening;Right;10 reps;Theraband   ? Theraband Level (Shoulder External Rotation) Level 3 (Green)   ? Internal Rotation Strengthening;Right;10 reps;Theraband   ? Theraband Level (Shoulder Internal Rotation) Level 3 (Green)   ?  ?  Shoulder Exercises: ROM/Strengthening  ? UBE (Upper Arm Bike) L3.0 x 6 min (3 min each fwd & back)   ? Wall Wash flex + scaption x 20 each   ? Other ROM/Strengthening Exercises cabinet reaches 5x with 2# weight   ?  ? Manual Therapy  ? Manual Therapy Soft tissue mobilization;Myofascial release   ? Soft tissue mobilization STM to long head of bicep, anterior deltoid   ? Myofascial Release TPR to bicep   ? ?  ?  ? ?  ? ? ? ? ? ? ? ? ? ? ? ? PT Short Term Goals - 01/18/22 1456   ? ?  ? PT SHORT TERM GOAL #1  ? Title Patient will be independent with initial HEP   ? Status Achieved   01/18/22  ? ?  ?  ? ?  ? ? ? ? PT Long Term Goals - 04/05/22 0929   ? ?  ? PT LONG TERM GOAL #1  ? Title Patient will be independent with ongoing/advanced HEP for self-management at home   ? Status Partially Met   ? Target Date  05/02/22   ?  ? PT LONG TERM GOAL #2  ? Title Patient to improve R shoulder AROM to WFL/WNL (flexion 160?, abduction 140? and ER 75?) without pain provocation   ? Status Partially Met   03/21/22 - met for ABD in supine; limited anti-gravity control  ? Target Date 05/02/22   ?  ? PT LONG TERM GOAL #3  ? Title Patient will demonstrate improved R shoulder strength to >/= 4/5 to 4+/5 for functional UE use   ? Status On-going   ? Target Date 05/02/22   ?  ? PT LONG TERM GOAL #4  ? Title Patient to demonstrate R shoulder elevation to overhead shelf with 3-5# with good scapular mechanics and no evidence of compensations   ? Status On-going   able to place 4lb weight onto OH shelf with B UE; good scapular mechanics  ? Target Date 05/02/22   ?  ? PT LONG TERM GOAL #5  ? Title Patient to report ability to perform ADLs, household, and work-related tasks including computer and mouse use without limitation due to R shoulder pain, LOM or weakness   ? Status Partially Met   04/02/22 - pt reports returning to work seems to be going well; only difficulty remains with reaching overhead  ? Target Date 05/02/22   ? ?  ?  ? ?  ? ? ? ? ? ? ? ?  Plan - 04/12/22 1158   ? ? Clinical Impression Statement Pt presented with increase tension in the R bicep muscle and soreness today, so the session was focused on MT to reduce pain. After MT pt noted less TTP and demonstrated improved overall ROM. R shoulder flexion and abduction had improved today with wall washes. FER could improve a little more but will plan to work more on this next visit. She is making progress, as we are continuing to work on [place]. Pt responded well.   ? Personal Factors and Comorbidities Age;Comorbidity 3+;Fitness;Past/Current Experience;Time since onset of injury/illness/exacerbation   ? Comorbidities Cervical fusion, HTN, depression, anxiety, BMI > 30, 3 falls in past year   ? PT Frequency 2x / week   ? PT Duration 6 weeks   ? PT Treatment/Interventions ADLs/Self Care Home Management;Cryotherapy;Electrical Stimulation;Moist Heat;Ultrasound;Functional mobility training;Therapeutic activities;Therapeutic exercise;Neuromuscular re-education;Patient/family education;Manual techniques;Scar mobilization;Passive range of motion;Dry needling;Taping;Vasopneumatic Device;Joint Manipulations   ? PT Next Visit Plan Rehab per RCR + Biceps tenodesis protocol - post-op week #16 as of 04/13/22 (sx 12/22/21) - phase 4   ? PT Home Exercise Plan Access Code: [email or web address]   ? Consulted and Agree with Plan of Care Patient   ? ?  ?  ? ?  ? ? ?Patient will benefit from skilled therapeutic intervention in order to improve the following deficits and impairments:  Decreased activity tolerance, Decreased knowledge of precautions, Decreased mobility, Decreased range of motion, Decreased scar mobility, Decreased strength, Increased edema, Increased fascial restricitons, Increased muscle spasms, Impaired perceived functional ability, Impaired flexibility, Impaired UE functional use, Improper body mechanics, Postural dysfunction, Pain ? ?Visit Diagnosis: ?Stiffness of right shoulder, not elsewhere  classified ? ?Acute pain of right shoulder ? ?Muscle weakness (generalized) ? ?Abnormal posture ? ?Other symptoms and signs involving the musculoskeletal system ? ? ? ? ?Problem List ?Patient Active Problem List  ? Diagnosis Date Noted  ? Depression   ? ? ?Artist Pais, PTA ?04/12/2022, 12:09 PM ? ? ?[facility] ?[address] ?[place],  [place], [address] ?Phone: [phone]   Fax:  [phone] ? ?Name: MAELANI YARBRO ?MRN: [id number] ?Date of Birth: [birth year] ? ? ? ?

## 2022-04-17 ENCOUNTER — Ambulatory Visit: Payer: Commercial Managed Care - PPO | Attending: Orthopedic Surgery

## 2022-04-17 DIAGNOSIS — M6281 Muscle weakness (generalized): Secondary | ICD-10-CM | POA: Insufficient documentation

## 2022-04-17 DIAGNOSIS — R29898 Other symptoms and signs involving the musculoskeletal system: Secondary | ICD-10-CM | POA: Diagnosis present

## 2022-04-17 DIAGNOSIS — R293 Abnormal posture: Secondary | ICD-10-CM | POA: Diagnosis present

## 2022-04-17 DIAGNOSIS — M25611 Stiffness of right shoulder, not elsewhere classified: Secondary | ICD-10-CM | POA: Insufficient documentation

## 2022-04-17 DIAGNOSIS — M25511 Pain in right shoulder: Secondary | ICD-10-CM | POA: Diagnosis present

## 2022-04-17 NOTE — Therapy (Signed)
[place] ?[facility] ?[address] ?[place], [place], [address] ?Phone: [phone]   Fax:  [phone] ? ?Physical Therapy Treatment ? ?Patient Details  ?Name: Meredith Mccormick ?MRN: [id number] ?Date of Birth: 11/15/1956 ?Referring Provider (PT): Justice Britain, MD ? ? ?Encounter Date: 04/17/2022 ? ? PT End of Session - 04/17/22 0932   ? ? Visit Number 25   ? Date for PT Re-Evaluation 05/02/22   ? Authorization Type UHC UMR   ? PT Start Time [phone]   ? PT Stop Time 0929   ? PT Time Calculation (min) 42 min   ? Activity Tolerance Patient tolerated treatment well   ? Behavior During Therapy [facility] for tasks assessed/performed   ? ?  ?  ? ?  ? ? ?Past Medical History:  ?Diagnosis Date  ? Depression   ? Hypertension   ? ? ?Past Surgical History:  ?Procedure Laterality Date  ? ABDOMINAL HYSTERECTOMY    ? CERVICAL SPINE SURGERY    ? ? ?There were no vitals filed for this visit. ? ? Subjective Assessment - 04/17/22 0849   ? ? Subjective Pt reports soreness in R shoulder this morning.   ? Pertinent History R RTC repair & biceps tenodesis 12/22/21   ? 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 Currently in Pain? Yes   ? Pain Score 3    ? Pain Location Shoulder   ? Pain Orientation Right;Posterior   ? Pain Descriptors / Indicators Sore   ? Pain Type Surgical pain   ? ?  ?  ? ?  ? ? ? ? ? [facility] PT Assessment - 04/17/22 0001   ? ?  ? ROM / Strength  ? AROM / PROM / Strength Strength   ?  ? Strength  ? Strength Assessment Site Shoulder   ? Right/Left Shoulder Right   ? Right Shoulder Flexion 4-/5   ? Right Shoulder ABduction 4-/5   ? Right Shoulder Internal Rotation 4+/5   ? Right Shoulder External Rotation 4-/5   ? ?  ?  ? ?  ? ? ? ? ? ? ? ? ? ? ? ? ? ? ? ? [facility] Adult PT Treatment/Exercise - 04/17/22 0001   ? ?  ? Shoulder Exercises: Standing  ? External Rotation Strengthening;Right;20 reps;Theraband   ? Theraband Level (Shoulder  External Rotation) Level 2 (Red)   too much compensation seen with GTB  ? Flexion Strengthening;10 reps;Weights;Right   ? Shoulder Flexion Weight (lbs) 1   ? ABduction Strengthening;Right;10 reps;Weights   ? Shoulder ABduction Weight (lbs) 1   ? Other Standing Exercises serratus slide with foam roll on wall 10x   ?  ? Shoulder Exercises: ROM/Strengthening  ? UBE (Upper Arm Bike) L3.0 x 6 min (3 min each fwd & back)   ?  ? Shoulder Exercises: Stretch  ? Other Shoulder Stretches posterior capsule stretch 2x30"   ?  ? Manual Therapy  ? Manual Therapy Soft tissue mobilization   ? Soft tissue mobilization STM to infraspinatus, teres group, lats, post deltoid   ? ?  ?  ? ?  ? ? ? ? ? ? ? ? ? ? ? ? PT Short Term Goals - 01/18/22 1456   ? ?  ? PT SHORT TERM GOAL #1  ? Title Patient will be independent with initial HEP   ? Status Achieved  01/18/22  ? ?  ?  ? ?  ? ? ? ? PT Long Term Goals - 04/17/22 0917   ? ?  ? PT LONG TERM GOAL #1  ? Title Patient will be independent with ongoing/advanced HEP for self-management at home   ? Status Partially Met   ? Target Date 05/02/22   ?  ? PT LONG TERM GOAL #2  ? Title Patient to improve R shoulder AROM to WFL/WNL (flexion 160?, abduction 140? and ER 75?) without pain provocation   ? Status Partially Met   03/21/22 - met for ABD in supine; limited anti-gravity control  ? Target Date 05/02/22   ?  ? PT LONG TERM GOAL #3  ? Title Patient will demonstrate improved R shoulder strength to >/= 4/5 to 4+/5 for functional UE use   ? Status On-going   04/17/22- see flowsheet  ? Target Date 05/02/22   ?  ? PT LONG TERM GOAL #4  ? Title Patient to demonstrate R shoulder elevation to overhead shelf with 3-5# with good scapular mechanics and no evidence of compensations   ? Status On-going   able to place 4lb weight onto OH shelf with B UE; good scapular mechanics  ? Target Date 05/02/22   ?  ? PT LONG TERM GOAL #5  ? Title Patient to report ability to perform ADLs, household, and work-related tasks  including computer and mouse use without limitation due to R shoulder pain, LOM or weakness   ? Status Partially Met   04/02/22 - pt reports returning to work seems to be going well; only difficulty remains with reaching overhead  ? Target Date 05/02/22   ? ?  ?  ? ?  ? ? ? ? ? ? ? ? Plan - 04/17/22 0933   ? ? Clinical Impression Statement Pt had increased pain ever since last night from an unknown cause, however this did not limit her performance with any exercises. She demonstrates weakness in shoulder flexion, abduction, and ER. MT was done to decrease pain in the posterior shoulder. Pt had no limitations with exercises but cuing required to correct form. She would continue to benefit from more strengthening exercises to restore R shoulder function.   ? Personal Factors and Comorbidities Age;Comorbidity 3+;Fitness;Past/Current Experience;Time since onset of injury/illness/exacerbation   ? Comorbidities Cervical fusion, HTN, depression, anxiety, BMI > 30, 3 falls in past year   ? PT Frequency 2x / week   ? PT Duration 6 weeks   ? PT Treatment/Interventions ADLs/Self Care Home Management;Cryotherapy;Electrical Stimulation;Moist Heat;Ultrasound;Functional mobility training;Therapeutic activities;Therapeutic exercise;Neuromuscular re-education;Patient/family education;Manual techniques;Scar mobilization;Passive range of motion;Dry needling;Taping;Vasopneumatic Device;Joint Manipulations   ? PT Next Visit Plan Rehab per RCR + Biceps tenodesis protocol - post-op week #16 as of 04/13/22 (sx 12/22/21) - phase 4   ? PT Home Exercise Plan Access Code: [email or web address]   ? Consulted and Agree with Plan of Care Patient   ? ?  ?  ? ?  ? ? ?Patient will benefit from skilled therapeutic intervention in order to improve the following deficits and impairments:  Decreased activity tolerance, Decreased knowledge of precautions, Decreased mobility, Decreased range of motion, Decreased scar mobility, Decreased strength, Increased edema,  Increased fascial restricitons, Increased muscle spasms, Impaired perceived functional ability, Impaired flexibility, Impaired UE functional use, Improper body mechanics, Postural dysfunction, Pain ? ?Visit Diagnosis: ?Stiffness of right shoulder, not elsewhere classified ? ?Acute pain of right shoulder ? ?Muscle weakness (generalized) ? ?Abnormal posture ? ?Other symptoms and signs  involving the musculoskeletal system ? ? ? ? ?Problem List ?Patient Active Problem List  ? Diagnosis Date Noted  ? Depression   ? ? ?Artist Pais, PTA ?04/17/2022, 10:34 AM ? ?[place] ?[facility] ?[address] ?[place], [place], [address] ?Phone: [phone]   Fax:  [phone] ? ?Name: Meredith Mccormick ?MRN: [id number] ?Date of Birth: 1956-07-06 ? ? ? ?

## 2022-04-19 ENCOUNTER — Ambulatory Visit: Payer: Commercial Managed Care - PPO | Admitting: Physical Therapy

## 2022-04-19 ENCOUNTER — Encounter: Payer: Self-pay | Admitting: Physical Therapy

## 2022-04-19 DIAGNOSIS — M25611 Stiffness of right shoulder, not elsewhere classified: Secondary | ICD-10-CM | POA: Diagnosis not present

## 2022-04-19 DIAGNOSIS — R293 Abnormal posture: Secondary | ICD-10-CM

## 2022-04-19 DIAGNOSIS — R29898 Other symptoms and signs involving the musculoskeletal system: Secondary | ICD-10-CM

## 2022-04-19 DIAGNOSIS — M25511 Pain in right shoulder: Secondary | ICD-10-CM

## 2022-04-19 DIAGNOSIS — M6281 Muscle weakness (generalized): Secondary | ICD-10-CM

## 2022-04-19 NOTE — Therapy (Signed)
[place] ?[facility] ?[address] ?[place], [place], [address] ?Phone: [phone]   Fax:  [phone] ? ?Physical Therapy Treatment ? ?Patient Details  ?Name: Meredith Mccormick ?MRN: [id number] ?Date of Birth: 10-17-1956 ?Referring Provider (PT): Justice Britain, MD ? ? ?Encounter Date: 04/19/2022 ? ? PT End of Session - 04/19/22 0803   ? ? Visit Number 26   ? Date for PT Re-Evaluation 05/02/22   ? Authorization Type UHC UMR   ? PT Start Time [phone]   ? PT Stop Time 6712   ? PT Time Calculation (min) 44 min   ? Activity Tolerance Patient tolerated treatment well   ? Behavior During Therapy [facility] for tasks assessed/performed   ? ?  ?  ? ?  ? ? ?Past Medical History:  ?Diagnosis Date  ? Depression   ? Hypertension   ? ? ?Past Surgical History:  ?Procedure Laterality Date  ? ABDOMINAL HYSTERECTOMY    ? CERVICAL SPINE SURGERY    ? ? ?There were no vitals filed for this visit. ? ? Subjective Assessment - 04/19/22 0811   ? ? Subjective Pt reports the feeling of a knot in the back of her shoulder today.   ? Pertinent History R RTC repair & biceps tenodesis 12/22/21   ? 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 Currently in Pain? Yes   ? Pain Score 3    2-3/10  ? Pain Location Shoulder   ? Pain Orientation Right;Posterior   ? Pain Descriptors / Indicators Sore   ? Pain Type Acute pain;Surgical pain   ? Pain Frequency Intermittent   ? ?  ?  ? ?  ? ? ? ? ? [facility] PT Assessment - 04/19/22 0803   ? ?  ? Assessment  ? Medical Diagnosis R RCR + bicpes tenodesis   ? Referring Provider (PT) Justice Britain, MD   ? Onset Date/Surgical Date 12/22/21   ? Next MD Visit 05/02/22   ? ?  ?  ? ?  ? ? ? ? ? ? ? ? ? ? ? ? ? ? ? ? [place] Adult PT Treatment/Exercise - 04/19/22 0803   ? ?  ? Shoulder Exercises: Standing  ? External Rotation Both;10 reps;Strengthening;Theraband   2 sets  ? Theraband Level (Shoulder External Rotation) Level 2 (Red)   ?  External Rotation Limitations standing with back against pool noodle on wall to promote increased scapular engagement   ? Flexion Both;10 reps;Strengthening;Weights   ? Shoulder Flexion Weight (lbs) 1   ? Flexion Limitations standing with back against pool noodle on wall to promote increased scapular engagement   ? ABduction Both;10 reps;Strengthening;Weights   ? Shoulder ABduction Weight (lbs) 1   ? ABduction Limitations standing with back against pool noodle on wall to promote increased scapular engagement   ? Other Standing Exercises Green TB lat pulldown 10 x 3", 2 sets   ?  ? Shoulder Exercises: Pulleys  ? Flexion 3 minutes   ? Scaption 3 minutes   ?  ? Shoulder Exercises: ROM/Strengthening  ? Ball on Wall R shoulder CW/CCW circles x 10, ABC's x 1 set   ?  ? Manual Therapy  ? Manual Therapy Soft tissue mobilization;Myofascial release;Scapular mobilization   ? Soft tissue mobilization STM to infraspinatus, teres group, lats, post deltoid   ? Myofascial Release manual TPR to R infraspinatus and teres  group   ? Scapular Mobilization R scap mobilization in all planes   ? ?  ?  ? ?  ? ? ? ? ? ? ? ? ? ? ? ? PT Short Term Goals - 01/18/22 1456   ? ?  ? PT SHORT TERM GOAL #1  ? Title Patient will be independent with initial HEP   ? Status Achieved   01/18/22  ? ?  ?  ? ?  ? ? ? ? PT Long Term Goals - 04/17/22 0917   ? ?  ? PT LONG TERM GOAL #1  ? Title Patient will be independent with ongoing/advanced HEP for self-management at home   ? Status Partially Met   ? Target Date 05/02/22   ?  ? PT LONG TERM GOAL #2  ? Title Patient to improve R shoulder AROM to WFL/WNL (flexion 160?, abduction 140? and ER 75?) without pain provocation   ? Status Partially Met   03/21/22 - met for ABD in supine; limited anti-gravity control  ? Target Date 05/02/22   ?  ? PT LONG TERM GOAL #3  ? Title Patient will demonstrate improved R shoulder strength to >/= 4/5 to 4+/5 for functional UE use   ? Status On-going   04/17/22- see flowsheet  ? Target  Date 05/02/22   ?  ? PT LONG TERM GOAL #4  ? Title Patient to demonstrate R shoulder elevation to overhead shelf with 3-5# with good scapular mechanics and no evidence of compensations   ? Status On-going   able to place 4lb weight onto OH shelf with B UE; good scapular mechanics  ? Target Date 05/02/22   ?  ? PT LONG TERM GOAL #5  ? Title Patient to report ability to perform ADLs, household, and work-related tasks including computer and mouse use without limitation due to R shoulder pain, LOM or weakness   ? Status Partially Met   04/02/22 - pt reports returning to work seems to be going well; only difficulty remains with reaching overhead  ? Target Date 05/02/22   ? ?  ?  ? ?  ? ? ? ? ? ? ? ? Plan - 04/19/22 0847   ? ? Clinical Impression Statement Meredith Mccormick reports continued increased muscle tension/tightness and pian in her posterior R shoulder which was addressed with further STM/TPR today. She continues to struggle with avoiding R shoulder protraction and shoulder hike during attempts at overhead elevation against gravity, therefore introduced scapular stabilization exercises with ball on wall as well as several exercises performed with back against pool noodle on wall to promote better scap retraction- pt noting increased fatigue with the former but able to gradually increase elevation in flexion and scaption with the latter with only minimal shoulder hike.   ? Comorbidities Cervical fusion, HTN, depression, anxiety, BMI > 30, 3 falls in past year   ? Rehab Potential Good   ? PT Frequency 2x / week   ? PT Duration 6 weeks   ? PT Treatment/Interventions ADLs/Self Care Home Management;Cryotherapy;Electrical Stimulation;Moist Heat;Ultrasound;Functional mobility training;Therapeutic activities;Therapeutic exercise;Neuromuscular re-education;Patient/family education;Manual techniques;Scar mobilization;Passive range of motion;Dry needling;Taping;Vasopneumatic Device;Joint Manipulations   ? PT Next Visit Plan Rehab  per RCR + Biceps tenodesis protocol - post-op week #17 as of 04/20/22 (sx 12/22/21) - phase 4   ? PT Home Exercise Plan Access Code: [email or web address]   ? Consulted and Agree with Plan of Care Patient   ? ?  ?  ? ?  ? ? ?Patient will  benefit from skilled therapeutic intervention in order to improve the following deficits and impairments:  Decreased activity tolerance, Decreased knowledge of precautions, Decreased mobility, Decreased range of motion, Decreased scar mobility, Decreased strength, Increased edema, Increased fascial restricitons, Increased muscle spasms, Impaired perceived functional ability, Impaired flexibility, Impaired UE functional use, Improper body mechanics, Postural dysfunction, Pain ? ?Visit Diagnosis: ?Stiffness of right shoulder, not elsewhere classified ? ?Acute pain of right shoulder ? ?Muscle weakness (generalized) ? ?Abnormal posture ? ?Other symptoms and signs involving the musculoskeletal system ? ? ? ? ?Problem List ?Patient Active Problem List  ? Diagnosis Date Noted  ? Depression   ? ? ?Percival Spanish, PT ?04/19/2022, 11:45 AM ? ?[place] ?[facility] ?[address] ?[place], [place], [address] ?Phone: [phone]   Fax:  [phone] ? ?Name: Meredith Mccormick ?MRN: [id number] ?Date of Birth: 1956/02/04 ? ? ? ?

## 2022-04-24 ENCOUNTER — Ambulatory Visit: Payer: Commercial Managed Care - PPO | Admitting: Physical Therapy

## 2022-04-24 ENCOUNTER — Encounter: Payer: Self-pay | Admitting: Physical Therapy

## 2022-04-24 DIAGNOSIS — R29898 Other symptoms and signs involving the musculoskeletal system: Secondary | ICD-10-CM

## 2022-04-24 DIAGNOSIS — M6281 Muscle weakness (generalized): Secondary | ICD-10-CM

## 2022-04-24 DIAGNOSIS — R293 Abnormal posture: Secondary | ICD-10-CM

## 2022-04-24 DIAGNOSIS — M25611 Stiffness of right shoulder, not elsewhere classified: Secondary | ICD-10-CM | POA: Diagnosis not present

## 2022-04-24 DIAGNOSIS — M25511 Pain in right shoulder: Secondary | ICD-10-CM

## 2022-04-24 NOTE — Patient Instructions (Signed)
Access Code: [email or web address] ?URL: [email or web address] ?Date: 04/24/2022 ?Prepared by: Raynelle Fanning ? ?Exercises ?- Supine Shoulder Flexion with Dowel (Mirrored)  - 2 x daily - 7 x weekly - 1-2 sets - 10 reps - 5 sec hold ?- Supine Shoulder External Rotation with Dowel (Mirrored)  - 2 x daily - 7 x weekly - 1-2 sets - 10 reps - 5 seconds hold ?- Shoulder Flexion Wall Slide with Towel  - 1 x daily - 7 x weekly - 1-3 sets - 10 reps ?- Seated Cervical Sidebending Stretch  - 2 x daily - 7 x weekly - 1 sets - 3 reps - 30-60 sec hold ?- Sidelying Shoulder Abduction Palm Forward  - 1 x daily - 7 x weekly - 1-3 sets - 10 reps ?- Sidelying Shoulder External Rotation AROM  - 1 x daily - 7 x weekly - 1-3 sets - 10 reps ?- Standing Bilateral Low Shoulder Row with Anchored Resistance  - 1 x daily - 3-4 x weekly - 3 sets - 10 reps ?- Shoulder Extension with Resistance  - 1 x daily - 3-4 x weekly - 3 sets - 10 reps ?- Isometric Shoulder Flexion at Wall  - 1 x daily - 7 x weekly - 2 sets - 5 reps - 5 sec hold ?- Isometric Shoulder Abduction at Wall  - 1 x daily - 7 x weekly - 2 sets - 5 reps - 5 sec hold ?- Isometric Shoulder Extension at Wall  - 1 x daily - 7 x weekly - 2 sets - 5 reps - 5 sec hold ?- Isometric Shoulder Internal Rotation  - 1 x daily - 7 x weekly - 2 sets - 5 reps - 5 sec hold ?- Isometric Shoulder External Rotation  - 1 x daily - 7 x weekly - 2 sets - 5 reps - 5 sec hold ?- Standing Shoulder Flexion to 90 Degrees  - 1 x daily - 3 x weekly - 1 sets - 10 reps - 3 sec hold ?- Standing Shoulder Scaption  - 1 x daily - 3 x weekly - 1 sets - 10 reps - 3 sec hold ?

## 2022-04-24 NOTE — Therapy (Signed)
[place] ?[address] ?[place], [place], [address] ?Phone: [phone]   Fax:  [phone] ? ?Physical Therapy Treatment ? ?Patient Details  ?Name: Meredith Mccormick ?MRN: [id number] ?Date of Birth: 01/18/1956 ?Referring Provider (PT): Justice Britain, MD ? ? ?Encounter Date: 04/24/2022 ? ? PT End of Session - 04/24/22 0802   ? ? Visit Number 27   ? Date for PT Re-Evaluation 05/02/22   ? Authorization Type UHC UMR   ? PT Start Time 0802   ? PT Stop Time 0845   ? PT Time Calculation (min) 43 min   ? Activity Tolerance Patient tolerated treatment well   ? Behavior During Therapy [facility] for tasks assessed/performed   ? ?  ?  ? ?  ? ? ?Past Medical History:  ?Diagnosis Date  ? Depression   ? Hypertension   ? ? ?Past Surgical History:  ?Procedure Laterality Date  ? ABDOMINAL HYSTERECTOMY    ? CERVICAL SPINE SURGERY    ? ? ?There were no vitals filed for this visit. ? ? Subjective Assessment - 04/24/22 0803   ? ? Subjective No change in knot in back of shoulder. Unable to complete HEP more than once per day and sometimes misses entirely due to work.   ? Pertinent History R RTC repair & biceps tenodesis 12/22/21   ? 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 Currently in Pain? Yes   ? Pain Score 2    ? Pain Location Shoulder   ? Pain Orientation Right;Posterior   ? Pain Descriptors / Indicators Sore   ? Pain Type Acute pain;Surgical pain   ? ?  ?  ? ?  ? ? ? ? ? ? ? ? ? ? ? ? ? ? ? ? ? ? ? ? [facility] Adult PT Treatment/Exercise - 04/24/22 0001   ? ?  ? Shoulder Exercises: Sidelying  ? External Rotation Strengthening;20 reps   ? ABduction Right;20 reps   ? ABduction Weight (lbs) 1   ?  ? Shoulder Exercises: Standing  ? External Rotation Both;10 reps;Strengthening;Theraband   2 sets  ? Theraband Level (Shoulder External Rotation) Level 2 (Red)   ? External Rotation Limitations standing with back against pool noodle on  wall to promote increased scapular engagement   ? Flexion Both;10 reps;Strengthening;Weights   ? Shoulder Flexion Weight (lbs) 1   ? Flexion Limitations standing with back against pool noodle on wall to promote increased scapular engagement   ? ABduction Limitations attempted ABD but unable to do without elevation today so did SDLY   ? Other Standing Exercises seated Green TB lat pulldown 10 x 3", 2 sets   ? Other Standing Exercises scaption x 10 no weight; wall push up + 2 x 10   ?  ? Shoulder Exercises: Pulleys  ? Flexion 3 minutes   ? Scaption 3 minutes   ?  ? Shoulder Exercises: ROM/Strengthening  ? Ball on Wall R shoulder CW/CCW circles x 10, ABC's x 1 set   only able to do ABCs to J  ? ?  ?  ? ?  ? ? ? ? ? ? ? ? ? ? PT Education - 04/24/22 1808   ? ? Education Details HEP   ? Person(s) Educated Patient   ? Methods Explanation;Demonstration;Handout   ? Comprehension Verbalized understanding;Returned demonstration   ? ?  ?  ? ?  ? ? ?  PT Short Term Goals - 01/18/22 1456   ? ?  ? PT SHORT TERM GOAL #1  ? Title Patient will be independent with initial HEP   ? Status Achieved   01/18/22  ? ?  ?  ? ?  ? ? ? ? PT Long Term Goals - 04/17/22 0917   ? ?  ? PT LONG TERM GOAL #1  ? Title Patient will be independent with ongoing/advanced HEP for self-management at home   ? Status Partially Met   ? Target Date 05/02/22   ?  ? PT LONG TERM GOAL #2  ? Title Patient to improve R shoulder AROM to WFL/WNL (flexion 160?, abduction 140? and ER 75?) without pain provocation   ? Status Partially Met   03/21/22 - met for ABD in supine; limited anti-gravity control  ? Target Date 05/02/22   ?  ? PT LONG TERM GOAL #3  ? Title Patient will demonstrate improved R shoulder strength to >/= 4/5 to 4+/5 for functional UE use   ? Status On-going   04/17/22- see flowsheet  ? Target Date 05/02/22   ?  ? PT LONG TERM GOAL #4  ? Title Patient to demonstrate R shoulder elevation to overhead shelf with 3-5# with good scapular mechanics and no evidence of  compensations   ? Status On-going   able to place 4lb weight onto OH shelf with B UE; good scapular mechanics  ? Target Date 05/02/22   ?  ? PT LONG TERM GOAL #5  ? Title Patient to report ability to perform ADLs, household, and work-related tasks including computer and mouse use without limitation due to R shoulder pain, LOM or weakness   ? Status Partially Met   04/02/22 - pt reports returning to work seems to be going well; only difficulty remains with reaching overhead  ? Target Date 05/02/22   ? ?  ?  ? ?  ? ? ? ? ? ? ? ? Plan - 04/24/22 0849   ? ? Clinical Impression Statement Merly is still reporting R shoulder pain which is better with active engagement of scapular muscles. We reviewed HEP again today and discharged isometrics. She was not able to perform active ABD today without activating R UT so we did in [facility] which she could do using 1# weight without difficulty and with good form. Still limited by fatigue with scapular stab exercises.   ? Personal Factors and Comorbidities Age;Comorbidity 3+;Fitness;Past/Current Experience;Time since onset of injury/illness/exacerbation   ? Comorbidities Cervical fusion, HTN, depression, anxiety, BMI > 30, 3 falls in past year   ? PT Frequency 2x / week   ? PT Duration 6 weeks   ? PT Treatment/Interventions ADLs/Self Care Home Management;Cryotherapy;Electrical Stimulation;Moist Heat;Ultrasound;Functional mobility training;Therapeutic activities;Therapeutic exercise;Neuromuscular re-education;Patient/family education;Manual techniques;Scar mobilization;Passive range of motion;Dry needling;Taping;Vasopneumatic Device;Joint Manipulations   ? PT Next Visit Plan Rehab per RCR + Biceps tenodesis protocol - post-op week #17 as of 04/20/22 (sx 12/22/21) - phase 4   ? PT Home Exercise Plan Access Code: [email or web address]   ? Consulted and Agree with Plan of Care Patient   ? ?  ?  ? ?  ? ? ?Patient will benefit from skilled therapeutic intervention in order to improve the following deficits  and impairments:  Decreased activity tolerance, Decreased knowledge of precautions, Decreased mobility, Decreased range of motion, Decreased scar mobility, Decreased strength, Increased edema, Increased fascial restricitons, Increased muscle spasms, Impaired perceived functional ability, Impaired flexibility, Impaired UE functional use, Improper body mechanics,  Postural dysfunction, Pain ? ?Visit Diagnosis: ?Stiffness of right shoulder, not elsewhere classified ? ?Acute pain of right shoulder ? ?Muscle weakness (generalized) ? ?Abnormal posture ? ?Other symptoms and signs involving the musculoskeletal system ? ? ? ? ?Problem List ?Patient Active Problem List  ? Diagnosis Date Noted  ? Depression   ? ? ?Madelyn Flavors, PT ?04/24/2022, 6:10 PM ? ?[facility] ?[facility] ?[address] ?[place], [place], [address] ?Phone: [phone]   Fax:  [phone] ? ?Name: STEPHINE LANGBEHN ?MRN: [id number] ?Date of Birth: 1956/12/06 ? ? ? ?

## 2022-04-27 ENCOUNTER — Ambulatory Visit: Payer: Commercial Managed Care - PPO

## 2022-04-27 DIAGNOSIS — M25611 Stiffness of right shoulder, not elsewhere classified: Secondary | ICD-10-CM | POA: Diagnosis not present

## 2022-04-27 DIAGNOSIS — M6281 Muscle weakness (generalized): Secondary | ICD-10-CM

## 2022-04-27 DIAGNOSIS — R29898 Other symptoms and signs involving the musculoskeletal system: Secondary | ICD-10-CM

## 2022-04-27 DIAGNOSIS — R293 Abnormal posture: Secondary | ICD-10-CM

## 2022-04-27 DIAGNOSIS — M25511 Pain in right shoulder: Secondary | ICD-10-CM

## 2022-04-27 NOTE — Patient Instructions (Signed)
Access Code: [email or web address] ?URL: [email or web address] ?Date: 04/27/2022 ?Prepared by: Verta Ellen ? ?Exercises ?- Supine Shoulder Flexion with Dowel (Mirrored)  - 2 x daily - 7 x weekly - 1-2 sets - 10 reps - 5 sec hold ?- Supine Shoulder External Rotation with Dowel (Mirrored)  - 2 x daily - 7 x weekly - 1-2 sets - 10 reps - 5 seconds hold ?- Shoulder Flexion Wall Slide with Towel  - 1 x daily - 7 x weekly - 1-3 sets - 10 reps ?- Seated Cervical Sidebending Stretch  - 2 x daily - 7 x weekly - 1 sets - 3 reps - 30-60 sec hold ?- Sidelying Shoulder Abduction Palm Forward  - 1 x daily - 7 x weekly - 1-3 sets - 10 reps ?- Sidelying Shoulder External Rotation AROM  - 1 x daily - 7 x weekly - 1-3 sets - 10 reps ?- Standing Bilateral Low Shoulder Row with Anchored Resistance  - 1 x daily - 3-4 x weekly - 3 sets - 10 reps ?- Shoulder Extension with Resistance  - 1 x daily - 3-4 x weekly - 3 sets - 10 reps ?- Standing Shoulder Flexion to 90 Degrees  - 1 x daily - 3 x weekly - 1 sets - 10 reps - 3 sec hold ?- Standing Shoulder Scaption  - 1 x daily - 3 x weekly - 1 sets - 10 reps - 3 sec hold ?- Shoulder External Rotation with Anchored Resistance  - 1 x daily - 3 x weekly - 2 sets - 10 reps ?

## 2022-04-27 NOTE — Therapy (Signed)
[facility] ?[facility] ?[address] ?[place], [place], [address] ?Phone: [phone]   Fax:  [phone] ? ?Physical Therapy Treatment ? ?Patient Details  ?Name: Meredith Mccormick ?MRN: [id number] ?Date of Birth: 02-24-1956 ?Referring Provider (PT): Justice Britain, MD ? ? ?Encounter Date: 04/27/2022 ? ? PT End of Session - 04/27/22 0932   ? ? Visit Number 28   ? Date for PT Re-Evaluation 05/02/22   ? Authorization Type UHC UMR   ? PT Start Time [phone]   ? PT Stop Time 0930   ? PT Time Calculation (min) 44 min   ? Activity Tolerance Patient tolerated treatment well   ? Behavior During Therapy [facility] for tasks assessed/performed   ? ?  ?  ? ?  ? ? ?Past Medical History:  ?Diagnosis Date  ? Depression   ? Hypertension   ? ? ?Past Surgical History:  ?Procedure Laterality Date  ? ABDOMINAL HYSTERECTOMY    ? CERVICAL SPINE SURGERY    ? ? ?There were no vitals filed for this visit. ? ? Subjective Assessment - 04/27/22 0849   ? ? Subjective I was able to reach my arm overhead with my elbow bent.   ? Pertinent History R RTC repair & biceps tenodesis 12/22/21   ? 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 Currently in Pain? Yes   ? Pain Score 2    ? Pain Location Shoulder   ? Pain Orientation Right;Posterior   ? Pain Descriptors / Indicators Sore   ? Pain Type Acute pain;Surgical pain   ? ?  ?  ? ?  ? ? ? ? ? ? ? ? ? ? ? ? ? ? ? ? ? ? ? ? [facility] Adult PT Treatment/Exercise - 04/27/22 0001   ? ?  ? Shoulder Exercises: Standing  ? External Rotation Right;20 reps;Theraband   ? Theraband Level (Shoulder External Rotation) Level 2 (Red)   ? External Rotation Limitations R UE elbow at side   ? Extension Strengthening;Both;10 reps;Theraband   ? Theraband Level (Shoulder Extension) Level 4 (Blue)   ? Row Both;10 reps;Strengthening;Theraband   ? Theraband Level (Shoulder Row) Level 4 (Blue)   ? Other Standing Exercises scaption and flexion  x 10 each no weight   ?  ? Shoulder Exercises: ROM/Strengthening  ? UBE (Upper Arm Bike) L3.0 x 6 min (3 min each fwd & back)   ? Lat Pull Limitations 20# 15 reps   ? Cybex Row Limitations 25# 15 reps   ? ?  ?  ? ?  ? ? ? ? ? ? ? ? ? ? PT Education - 04/27/22 0917   ? ? Education Details HEP update   ? Person(s) Educated Patient   ? Methods Explanation;Demonstration;Handout   ? Comprehension Verbalized understanding;Returned demonstration   ? ?  ?  ? ?  ? ? ? PT Short Term Goals - 01/18/22 1456   ? ?  ? PT SHORT TERM GOAL #1  ? Title Patient will be independent with initial HEP   ? Status Achieved   01/18/22  ? ?  ?  ? ?  ? ? ? ? PT Long Term Goals - 04/27/22 0923   ? ?  ? PT LONG TERM GOAL #1  ? Title Patient will be independent with ongoing/advanced HEP for self-management at home   ? Status Partially Met   ?  Target Date 05/02/22   ?  ? PT LONG TERM GOAL #2  ? Title Patient to improve R shoulder AROM to WFL/WNL (flexion 160?, abduction 140? and ER 75?) without pain provocation   ? Status Partially Met   03/21/22 - met for ABD in supine; limited anti-gravity control  ? Target Date 05/02/22   ?  ? PT LONG TERM GOAL #3  ? Title Patient will demonstrate improved R shoulder strength to >/= 4/5 to 4+/5 for functional UE use   ? Status On-going   04/17/22- see flowsheet  ? Target Date 05/02/22   ?  ? PT LONG TERM GOAL #4  ? Title Patient to demonstrate R shoulder elevation to overhead shelf with 3-5# with good scapular mechanics and no evidence of compensations   ? Status On-going   pt able to raise 3# weight to head height w/o UT compensation but not OH  ? Target Date 05/02/22   ?  ? PT LONG TERM GOAL #5  ? Title Patient to report ability to perform ADLs, household, and work-related tasks including computer and mouse use without limitation due to R shoulder pain, LOM or weakness   ? Status Partially Met   04/02/22 - pt reports returning to work seems to be going well; only difficulty remains with reaching overhead  ? Target  Date 05/02/22   ? ?  ?  ? ?  ? ? ? ? ? ? ? ? Plan - 04/27/22 0932   ? ? Clinical Impression Statement Today reviewed HEP with pt to ensure understanding for D/C. Made an update to HEP and gave instruction on ways to progress exercises. Cueing provided throughout session as needed to correct form with exercises. She was unable to reach with 3# weight over her head but to head height, so is making progress toward LTG #4. Her ROM did appear to be more [facility] today. Next visit she will need progress note and plan   ? Personal Factors and Comorbidities Age;Comorbidity 3+;Fitness;Past/Current Experience;Time since onset of injury/illness/exacerbation   ? Comorbidities Cervical fusion, HTN, depression, anxiety, BMI > 30, 3 falls in past year   ? PT Frequency 2x / week   ? PT Duration 6 weeks   ? PT Treatment/Interventions ADLs/Self Care Home Management;Cryotherapy;Electrical Stimulation;Moist Heat;Ultrasound;Functional mobility training;Therapeutic activities;Therapeutic exercise;Neuromuscular re-education;Patient/family education;Manual techniques;Scar mobilization;Passive range of motion;Dry needling;Taping;Vasopneumatic Device;Joint Manipulations   ? PT Next Visit Plan Rehab per RCR + Biceps tenodesis protocol - post-op week #18 as of 04/27/22 (sx 12/22/21) - phase 4   ? PT Home Exercise Plan Access Code: [email or web address]   ? Consulted and Agree with Plan of Care Patient   ? ?  ?  ? ?  ? ? ?Patient will benefit from skilled therapeutic intervention in order to improve the following deficits and impairments:  Decreased activity tolerance, Decreased knowledge of precautions, Decreased mobility, Decreased range of motion, Decreased scar mobility, Decreased strength, Increased edema, Increased fascial restricitons, Increased muscle spasms, Impaired perceived functional ability, Impaired flexibility, Impaired UE functional use, Improper body mechanics, Postural dysfunction, Pain ? ?Visit Diagnosis: ?Stiffness of right shoulder, not  elsewhere classified ? ?Acute pain of right shoulder ? ?Muscle weakness (generalized) ? ?Abnormal posture ? ?Other symptoms and signs involving the musculoskeletal system ? ? ? ? ?Problem List ?Patient Active Problem List  ? Diagnosis Date Noted  ? Depression   ? ? ?Artist Pais, PTA ?04/27/2022, 12:05 PM ? ?[place] ?[facility] ?[place]  [place], [place], [address] ?Phone: [phone]   Fax:  [phone] ? ?Name: FRAYDA EGLEY ?MRN: [id number] ?Date of Birth: 07/26/1956 ? ? ? ?

## 2022-05-01 ENCOUNTER — Encounter: Payer: Self-pay | Admitting: Physical Therapy

## 2022-05-01 ENCOUNTER — Ambulatory Visit: Payer: Commercial Managed Care - PPO | Admitting: Physical Therapy

## 2022-05-01 DIAGNOSIS — M25611 Stiffness of right shoulder, not elsewhere classified: Secondary | ICD-10-CM

## 2022-05-01 DIAGNOSIS — M25511 Pain in right shoulder: Secondary | ICD-10-CM

## 2022-05-01 DIAGNOSIS — R293 Abnormal posture: Secondary | ICD-10-CM

## 2022-05-01 DIAGNOSIS — M6281 Muscle weakness (generalized): Secondary | ICD-10-CM

## 2022-05-01 DIAGNOSIS — R29898 Other symptoms and signs involving the musculoskeletal system: Secondary | ICD-10-CM

## 2022-05-01 NOTE — Therapy (Signed)
[place] ?[facility] ?[address] ?[place], [place], [address] ?Phone: [phone]   Fax:  [phone] ? ?Physical Therapy Treatment / Recert ? ?Patient Details  ?Name: Meredith Mccormick ?MRN: [id number] ?Date of Birth: 09/06/1956 ?Referring Provider (PT): Justice Britain, MD ? ?Progress Note ? ?Reporting Period 04/02/2022 to 05/01/2022 ? ?See note below for Objective Data and Assessment of Progress/Goals.  ? ? ? ?Encounter Date: 05/01/2022 ? ? PT End of Session - 05/01/22 0851   ? ? Visit Number 29   ? Date for PT Re-Evaluation 06/12/22   ? Authorization Type UHC UMR   ? PT Start Time [phone]   ? PT Stop Time [phone]   ? PT Time Calculation (min) 46 min   ? Activity Tolerance Patient tolerated treatment well   ? Behavior During Therapy [facility] for tasks assessed/performed   ? ?  ?  ? ?  ? ? ?Past Medical History:  ?Diagnosis Date  ? Depression   ? Hypertension   ? ? ?Past Surgical History:  ?Procedure Laterality Date  ? ABDOMINAL HYSTERECTOMY    ? CERVICAL SPINE SURGERY    ? ? ?There were no vitals filed for this visit. ? ? Subjective Assessment - 05/01/22 0854   ? ? Subjective Pt reports she is "now able to get her arm in the air but it's not pretty".   ? Pertinent History R RTC repair & biceps tenodesis 12/22/21   ? 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 Currently in Pain? Yes   ? Pain Score 2    ? Pain Location Arm   biceps  ? Pain Orientation Right;Upper;Anterior   ? Pain Descriptors / Indicators Aching   ? Pain Type Acute pain;Chronic pain   ? Pain Frequency Intermittent   ? ?  ?  ? ?  ? ? ? ? ? [place] PT Assessment - 05/01/22 0851   ? ?  ? Assessment  ? Medical Diagnosis R RCR + biceps tenodesis   ? Referring Provider (PT) Justice Britain, MD   ? Onset Date/Surgical Date 12/22/21   ? Next MD Visit 05/02/22   ?  ? Prior Function  ? Level of Independence Independent   ? Vocation Full time employment;Works at home   ?  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 Leisure reorganizing her house, would like to get into reading more and scrapbooking; no regular exercise   ?  ? AROM  ? Right Shoulder Flexion 118 Degrees   seated; 148? supine  ? Right Shoulder ABduction 109 Degrees   seated; 134? supine  ? Right Shoulder Internal Rotation 78 Degrees   at ~80 dg ABD supine  ? Right Shoulder External Rotation 68 Degrees   at ~80 dg ABD supine  ?  ? Strength  ? Right Shoulder Flexion 4-/5   ? Right Shoulder Extension 4+/5   ? Right Shoulder ABduction 4-/5   ? Right Shoulder Internal Rotation 4+/5   ? Right Shoulder External Rotation 4-/5   ? ?  ?  ? ?  ? ? ? ? ? ? ? ? ? ? ? ? ? ? ? ? [facility] Adult PT Treatment/Exercise - 05/01/22 0851   ? ?  ? Shoulder Exercises: Prone  ? Retraction Right;10 reps;Strengthening;Weights   2 sets  ? Retraction Weight (lbs) 4   ? Retraction Limitations Row - seated  with trunk flexed and upper body supported on L arm in lap - cues for scap retraction avoiding actively flexing biceps   ? Extension Right;10 reps;Strengthening;Weights   2 sets  ? Extension Weight (lbs) 2,4   2# on 1st set, 4# on 2nd set  ? Extension Limitations seated with trunk flexed and upper body supported on L arm in lap - cues for scap retraction   ?  ? Shoulder Exercises: Standing  ? External Rotation Right;10 reps;Strengthening;Theraband   2 sets  ? Theraband Level (Shoulder External Rotation) Level 2 (Red)   ? External Rotation Limitations towel roll under arm for neutral shoulder   ? Flexion Both;10 reps;Strengthening;Weights   ? Shoulder Flexion Weight (lbs) 1   ? Flexion Limitations standing with back against pool noodle on wall to promote increased scapular engagement   ? ABduction Both;10 reps;Strengthening;Weights   ? Shoulder ABduction Weight (lbs) 1   ? ABduction Limitations scaption - standing with back against pool noodle on wall to promote increased scapular engagement   ?  ? Shoulder Exercises: ROM/Strengthening  ?  UBE (Upper Arm Bike) L3.0 x 6 min (3 min each fwd & back)   ? ?  ?  ? ?  ? ? ? ? ? ? ? ? ? ? ? ? PT Short Term Goals - 01/18/22 1456   ? ?  ? PT SHORT TERM GOAL #1  ? Title Patient will be independent with initial HEP   ? Status Achieved   01/18/22  ? ?  ?  ? ?  ? ? ? ? PT Long Term Goals - 05/01/22 0858   ? ?  ? PT LONG TERM GOAL #1  ? Title Patient will be independent with ongoing/advanced HEP for self-management at home   ? Status Partially Met   05/02/22 - met for current HEP  ? Target Date 06/12/22   ?  ? PT LONG TERM GOAL #2  ? Title Patient to improve R shoulder AROM to WFL/WNL (flexion 160?, abduction 140? and ER 75?) without pain provocation   ? Status On-going   ? Target Date 06/12/22   ?  ? PT LONG TERM GOAL #3  ? Title Patient will demonstrate improved R shoulder strength to >/= 4/5 to 4+/5 for functional UE use   ? Status Partially Met   05/01/22 - met for IR  ? Target Date 06/12/22   ?  ? PT LONG TERM GOAL #4  ? Title Patient to demonstrate R shoulder elevation to overhead shelf with 3-5# with good scapular mechanics and no evidence of compensations   ? Status Partially Met   05/01/22 - able to place a 3# wt on a shoulder height shelf but increased effort/slight shoulder shrug noted when attempting to place weight on an overhead shelf  ? Target Date 06/12/22   ?  ? PT LONG TERM GOAL #5  ? Title Patient to report ability to perform ADLs, household, and work-related tasks including computer and mouse use without limitation due to R shoulder pain, LOM or weakness   ? Status Partially Met   05/01/22 - pt reports better tolerance for computer/mouse usage but still has to use L arm for overhead reaching and 2 hands when lifting more than 1 dish at a time into the cabinets  ? Target Date 06/12/22   ? ?  ?  ? ?  ? ? ? ? ? ? ? ? Plan - 05/01/22 0918   ? ? Clinical Impression  Statement [name] reports gradually improving functional use of R UE but still limited in overhead motions with continued substitution evident  with R shoulder protraction and shoulder hike during attempts at overhead elevation against gravity. Overall R shoulder strength 4-/5 for available ROM with only IR 4+/5. Some intermittent tightness, currently in biceps and posterior shoulder complex, also limiting muscle activation and coordination to allow for proper [place] movement. Given continued limited overhead motion with ongoing utilization of substitution patterns and weakness in most shoulder motions, will recommend recert for additional 0-3E/SP for up to 6 weeks to promote better functional ROM and strength in R shoulder.   ? Personal Factors and Comorbidities Age;Comorbidity 3+;Fitness;Past/Current Experience;Time since onset of injury/illness/exacerbation   ? Comorbidities Cervical fusion, HTN, depression, anxiety, BMI > 30, 3 falls in past year   ? Examination-Activity Limitations Reach Overhead;Lift;Carry   ? Examination-Participation Restrictions Community Activity;Occupation;Meal Prep;[employer] Work   ? PT Frequency 2x / week   1-2x/wk - 2x/wk initially tapering to 1x/wk  ? PT Duration 6 weeks   ? PT Treatment/Interventions ADLs/Self Care Home Management;Cryotherapy;Electrical Stimulation;Moist Heat;Ultrasound;Functional mobility training;Therapeutic activities;Therapeutic exercise;Neuromuscular re-education;Patient/family education;Manual techniques;Scar mobilization;Passive range of motion;Dry needling;Taping;Vasopneumatic Device;Joint Manipulations;Iontophoresis 70m/ml Dexamethasone   ? PT Next Visit Plan Shoulder FOTO; Rehab per RCR + Biceps tenodesis protocol - post-op week #19 as of 05/04/22 (sx 12/22/21) - phase 4   ? PT Home Exercise Plan Access Code: [email or web address]  ? Consulted and Agree with Plan of Care Patient   ? ?  ?  ? ?  ? ? ?Patient will benefit from skilled therapeutic intervention in order to improve the following deficits and impairments:  Decreased activity tolerance, Decreased knowledge of precautions, Decreased mobility, Decreased range of  motion, Decreased scar mobility, Decreased strength, Increased edema, Increased fascial restricitons, Increased muscle spasms, Impaired perceived functional ability, Impaired flexibility, Impaired UE functio

## 2022-05-04 ENCOUNTER — Ambulatory Visit: Payer: Commercial Managed Care - PPO

## 2022-05-04 DIAGNOSIS — M25611 Stiffness of right shoulder, not elsewhere classified: Secondary | ICD-10-CM | POA: Diagnosis not present

## 2022-05-04 DIAGNOSIS — R29898 Other symptoms and signs involving the musculoskeletal system: Secondary | ICD-10-CM

## 2022-05-04 DIAGNOSIS — R293 Abnormal posture: Secondary | ICD-10-CM

## 2022-05-04 DIAGNOSIS — M25511 Pain in right shoulder: Secondary | ICD-10-CM

## 2022-05-04 DIAGNOSIS — M6281 Muscle weakness (generalized): Secondary | ICD-10-CM

## 2022-05-04 NOTE — Therapy (Signed)
[facility] [address]  [place], [place], [address] Phone: [phone]   Fax:  [phone]  Physical Therapy Treatment  Patient Details  Name: Meredith Mccormick MRN: [id number] Date of Birth: 07-Dec-1956 Referring Provider (PT): Justice Britain, MD   Encounter Date: 05/04/2022   PT End of Session - 05/04/22 0845     Visit Number 30    Date for PT Re-Evaluation 06/12/22    Authorization Type [employer] UMR    PT Start Time 0801    PT Stop Time 0844    PT Time Calculation (min) 43 min    Activity Tolerance Patient tolerated treatment well    Behavior During Therapy [facility] for tasks assessed/performed             Past Medical History:  Diagnosis Date   Depression    Hypertension     Past Surgical History:  Procedure Laterality Date   ABDOMINAL HYSTERECTOMY     CERVICAL SPINE SURGERY      There were no vitals filed for this visit.   Subjective Assessment - 05/04/22 0805     Subjective 'The bicep and upper arm in just tight.' Went to the MD Wednesday and they were pleased.    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 No/denies              Rationale for Evaluation and Treatment Rehabilitation                  [facility] Adult PT Treatment/Exercise - 05/04/22 0001       Shoulder Exercises: Standing   External Rotation Strengthening;Right;10 reps;Theraband    Theraband Level (Shoulder External Rotation) Level 2 (Red)    External Rotation Limitations isometric step out, 5 sec hold    Flexion Strengthening;Both;20 reps;Weights    Shoulder Flexion Weight (lbs) 1    Flexion Limitations standing with back against dporframe on wall to promote increased scapular engagement    ABduction Strengthening;Both;20 reps;Weights    Shoulder ABduction Weight (lbs) 1     ABduction Limitations scaption - standing with back against doorframe to promote increased scapular engagement      Shoulder Exercises: ROM/Strengthening   UBE (Upper Arm Bike) L3.5 x 6 min (3 min each fwd & back)    Lat Pull Limitations 10# standing extension 2x10    Cybex Row Limitations 25# 2x10 reps      Manual Therapy   Manual Therapy Soft tissue mobilization    Soft tissue mobilization IASTM to R biceps, ant deltoid                       PT Short Term Goals - 01/18/22 1456       PT SHORT TERM GOAL #1   Title Patient will be independent with initial HEP    Status Achieved   01/18/22              PT Long Term Goals - 05/01/22 0858       PT LONG TERM GOAL #1   Title Patient will be independent with ongoing/advanced HEP for self-management at home    Status Partially Met   05/02/22 - met for current HEP   Target Date 06/12/22      PT LONG TERM GOAL #2  Title Patient to improve R shoulder AROM to WFL/WNL (flexion 160, abduction 140 and ER 75) without pain provocation    Status On-going    Target Date 06/12/22      PT LONG TERM GOAL #3   Title Patient will demonstrate improved R shoulder strength to >/= 4/5 to 4+/5 for functional UE use    Status Partially Met   05/01/22 - met for IR   Target Date 06/12/22      PT LONG TERM GOAL #4   Title Patient to demonstrate R shoulder elevation to overhead shelf with 3-5# with good scapular mechanics and no evidence of compensations    Status Partially Met   05/01/22 - able to place a 3# wt on a shoulder height shelf but increased effort/slight shoulder shrug noted when attempting to place weight on an overhead shelf   Target Date 06/12/22      PT LONG TERM GOAL #5   Title Patient to report ability to perform ADLs, household, and work-related tasks including computer and mouse use without limitation due to R shoulder pain, LOM or weakness    Status Partially Met   05/01/22 - pt reports better tolerance for  computer/mouse usage but still has to use L arm for overhead reaching and 2 hands when lifting more than 1 dish at a time into the cabinets   Target Date 06/12/22                   Plan - 05/04/22 0846     Clinical Impression Statement Pt has recently seen her doctor, they are pleased with her progress with ROM but her R shoulder still shows weakness and low endurance with exercises. She had reports of tightness in her R bicep area from typing all day and using her computer mouse. Multiple areas of muscle restriction were found along this area. She showed a good response to TE today with no complaints of pain, cueing provided throughout session for max benefit with interventions.    Personal Factors and Comorbidities Age;Comorbidity 3+;Fitness;Past/Current Experience;Time since onset of injury/illness/exacerbation    Comorbidities Cervical fusion, HTN, depression, anxiety, BMI > 30, 3 falls in past year    PT Frequency 2x / week   1-2x per week   PT Duration 6 weeks    PT Treatment/Interventions ADLs/Self Care Home Management;Cryotherapy;Electrical Stimulation;Moist Heat;Ultrasound;Functional mobility training;Therapeutic activities;Therapeutic exercise;Neuromuscular re-education;Patient/family education;Manual techniques;Scar mobilization;Passive range of motion;Dry needling;Taping;Vasopneumatic Device;Joint Manipulations;Iontophoresis 30m/ml Dexamethasone    PT Next Visit Plan Shoulder FOTO; Rehab per RCR + Biceps tenodesis protocol - post-op week #19 as of 05/04/22 (sx 12/22/21) - phase 4    PT Home Exercise Plan Access Code: [email or web address]   Consulted and Agree with Plan of Care Patient             Patient will benefit from skilled therapeutic intervention in order to improve the following deficits and impairments:  Decreased activity tolerance, Decreased knowledge of precautions, Decreased mobility, Decreased range of motion, Decreased scar mobility, Decreased strength, Increased edema,  Increased fascial restricitons, Increased muscle spasms, Impaired perceived functional ability, Impaired flexibility, Impaired UE functional use, Improper body mechanics, Postural dysfunction, Pain  Visit Diagnosis: Stiffness of right shoulder, not elsewhere classified  Acute pain of right shoulder  Muscle weakness (generalized)  Abnormal posture  Other symptoms and signs involving the musculoskeletal system     Problem List Patient Active Problem List   Diagnosis Date Noted   Depression     BArtist Pais PTA  05/04/2022, 9:47 AM  [facility] [address]  [place], [place], [address] Phone: [phone]   Fax:  [phone]  Name: Meredith Mccormick MRN: [id number] Date of Birth: 1956/06/24

## 2022-05-08 ENCOUNTER — Encounter: Payer: Self-pay | Admitting: Physical Therapy

## 2022-05-08 ENCOUNTER — Ambulatory Visit: Payer: Commercial Managed Care - PPO | Admitting: Physical Therapy

## 2022-05-08 DIAGNOSIS — M25611 Stiffness of right shoulder, not elsewhere classified: Secondary | ICD-10-CM | POA: Diagnosis not present

## 2022-05-08 DIAGNOSIS — M25511 Pain in right shoulder: Secondary | ICD-10-CM

## 2022-05-08 DIAGNOSIS — R293 Abnormal posture: Secondary | ICD-10-CM

## 2022-05-08 DIAGNOSIS — M6281 Muscle weakness (generalized): Secondary | ICD-10-CM

## 2022-05-08 DIAGNOSIS — R29898 Other symptoms and signs involving the musculoskeletal system: Secondary | ICD-10-CM

## 2022-05-08 NOTE — Therapy (Addendum)
[facility] [address]  [place], [place], [address] Phone: [phone]   Fax:  [phone]  Physical Therapy Treatment / Discharge Summary  Patient Details  Name: Meredith Mccormick MRN: [id number] Date of Birth: 08/01/1956 Referring Provider (PT): Justice Britain, MD   Encounter Date: 05/08/2022   PT End of Session - 05/08/22 0806     Visit Number 31    Date for PT Re-Evaluation 06/12/22    Authorization Type [employer] UMR    PT Start Time 0802    PT Stop Time 0841    PT Time Calculation (min) 39 min    Activity Tolerance Patient tolerated treatment well    Behavior During Therapy [facility] for tasks assessed/performed             Past Medical History:  Diagnosis Date   Depression    Hypertension     Past Surgical History:  Procedure Laterality Date   ABDOMINAL HYSTERECTOMY     CERVICAL SPINE SURGERY      There were no vitals filed for this visit.   Subjective Assessment - 05/08/22 0804     Subjective Pt reports she was released by the MD and was told she could stop PT and continue on her own with the HEP.    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 No/denies                [facility] PT Assessment - 05/08/22 0802       Assessment   Medical Diagnosis R RCR + biceps tenodesis    Referring Provider (PT) Justice Britain, MD    Onset Date/Surgical Date 12/22/21    Next MD Visit released by MD      Observation/Other Assessments   Focus on Therapeutic Outcomes (FOTO)  Shoulder = 62      AROM   Right Shoulder Flexion 118 Degrees   seated; 160 supine   Right Shoulder ABduction 109 Degrees   seated; 142 supine   Right Shoulder Internal Rotation 81 Degrees   at ~80 dg ABD supine   Right Shoulder External Rotation 70 Degrees   at ~80 dg ABD supine     Strength   Right  Shoulder Flexion 4/5    Right Shoulder Extension 4+/5    Right Shoulder ABduction 4-/5    Right Shoulder Internal Rotation 4+/5    Right Shoulder External Rotation 4/5                           [facility] Adult PT Treatment/Exercise - 05/08/22 0802       Shoulder Exercises: Supine   External Rotation Both;10 reps;AAROM;Right    External Rotation Limitations 90/90 wand ER      Shoulder Exercises: Seated   External Rotation Right;10 reps;AAROM    External Rotation Limitations wand stretch      Shoulder Exercises: ROM/Strengthening   UBE (Upper Arm Bike) L3.0 x 6 min (3 min each fwd & back)                     PT Education - 05/08/22 0839     Education Details Final HEP review    Person(s) Educated Patient    Methods Explanation;Demonstration;Handout    Comprehension Verbalized understanding;Returned  demonstration              PT Short Term Goals - 01/18/22 1456       PT SHORT TERM GOAL #1   Title Patient will be independent with initial HEP    Status Achieved   01/18/22              PT Long Term Goals - 05/08/22 0807       PT LONG TERM GOAL #1   Title Patient will be independent with ongoing/advanced HEP for self-management at home    Status Achieved   05/08/22   Target Date 06/12/22      PT LONG TERM GOAL #2   Title Patient to improve R shoulder AROM to WFL/WNL (flexion 160, abduction 140 and ER 75) without pain provocation    Status Partially Met   05/08/22 - met for gravity minimzed AROM   Target Date 06/12/22      PT LONG TERM GOAL #3   Title Patient will demonstrate improved R shoulder strength to >/= 4/5 to 4+/5 for functional UE use    Status Partially Met   05/08/22 - met except R shoulder ABD 4-/5   Target Date 06/12/22      PT LONG TERM GOAL #4   Title Patient to demonstrate R shoulder elevation to overhead shelf with 3-5# with good scapular mechanics and no evidence of compensations    Status Partially Met   05/08/22 -  able to place a 3# wt on a shoulder height shelf but but only 1# to an overhead shelf   Target Date 06/12/22      PT LONG TERM GOAL #5   Title Patient to report ability to perform ADLs, household, and work-related tasks including computer and mouse use without limitation due to R shoulder pain, LOM or weakness    Status Achieved   05/08/22   Target Date 06/12/22                   Plan - 05/08/22 0806     Clinical Impression Statement Meredith Mccormick reports she has had a good week w/o any tense muscles or knots and feels that this is allowing her to better move and use her R shoulder. She states as of her MD visit last week, the MD released her and told her she could stop PT and continue on her own with her HEP - given her busy schedule and work hours, she would like to try this as of today. R shoulder AROM has continued to improve with goal now met in gravity minimized position, but still more limited with AROM against gravity. R shoulder strength >/= 4/5 for all motions except abduction and pt notes she is able to use her R arm functionally with most daily tasks. All goals met or partially met. We verbally reviewed her HEP and provided alternative options for ER AAROM/stretching wit pt denying need for further review of any other exercises. Per Meredith Mccormick's request, will proceed with transition to her HEP but will keep her on hold for PT x 30 days.    Comorbidities Cervical fusion, HTN, depression, anxiety, BMI > 30, 3 falls in past year    PT Frequency --   1-2x per week   PT Treatment/Interventions ADLs/Self Care Home Management;Cryotherapy;Electrical Stimulation;Moist Heat;Ultrasound;Functional mobility training;Therapeutic activities;Therapeutic exercise;Neuromuscular re-education;Patient/family education;Manual techniques;Scar mobilization;Passive range of motion;Dry needling;Taping;Vasopneumatic Device;Joint Manipulations;Iontophoresis 29m/ml Dexamethasone    PT Next Visit Plan Transition to  HEP + 30-day hold  PT Home Exercise Plan Access Code: [email or web address]    [email or web address] and Agree with Plan of Care Patient             Patient will benefit from skilled therapeutic intervention in order to improve the following deficits and impairments:  Decreased activity tolerance, Decreased knowledge of precautions, Decreased mobility, Decreased range of motion, Decreased scar mobility, Decreased strength, Increased edema, Increased fascial restricitons, Increased muscle spasms, Impaired perceived functional ability, Impaired flexibility, Impaired UE functional use, Improper body mechanics, Postural dysfunction, Pain   Rationale for Evaluation and Treatment: Rehabilitation   Visit Diagnosis: Stiffness of right shoulder, not elsewhere classified  Acute pain of right shoulder  Muscle weakness (generalized)  Abnormal posture  Other symptoms and signs involving the musculoskeletal system     Problem List Patient Active Problem List   Diagnosis Date Noted   Depression     Percival Spanish, PT 05/08/2022, 12:30 PM  [facility] [address]  [place], [place], [address] Phone: [phone]   Fax:  [phone]  Name: Meredith Mccormick MRN: [id number] Date of Birth: Nov 20, 1956   [place] SUMMARY  Visits from Start of Care: 31  Current functional level related to goals / functional outcomes:   Refer to above clinical impression for status as of last visit on 05/08/2022. Patient was placed on hold for 30 days and has not needed to return to PT, therefore will proceed with discharge from PT for this episode.   Remaining deficits:   As above.   Education / Equipment:   HEP   Patient agrees to discharge. Patient goals were partially met. Patient is being discharged due to being pleased with the current functional level.  Percival Spanish, PT, MPT 07/26/22, 2:34 PM  [facility] [address]  [place], [place], [address] Phone: [phone]   Fax:  [phone]

## 2022-05-08 NOTE — Patient Instructions (Signed)
   Access Code: [email or web address] URL: [email or web address] Date: 05/08/2022 Prepared by: Glenetta Hew  Exercises - Supine Shoulder Flexion with Dowel (Mirrored)  - 2 x daily - 7 x weekly - 1-2 sets - 10 reps - 5 sec hold - Supine Shoulder External Rotation with Dowel (Mirrored)  - 2 x daily - 7 x weekly - 1-2 sets - 10 reps - 5 seconds hold - Shoulder Flexion Wall Slide with Towel  - 1 x daily - 7 x weekly - 1-3 sets - 10 reps - Seated Cervical Sidebending Stretch  - 2 x daily - 7 x weekly - 1 sets - 3 reps - 30-60 sec hold - Sidelying Shoulder Abduction Palm Forward  - 1 x daily - 7 x weekly - 1-3 sets - 10 reps - Sidelying Shoulder External Rotation AROM  - 1 x daily - 7 x weekly - 1-3 sets - 10 reps - Standing Bilateral Low Shoulder Row with Anchored Resistance  - 1 x daily - 3-4 x weekly - 3 sets - 10 reps - Shoulder Extension with Resistance  - 1 x daily - 3-4 x weekly - 3 sets - 10 reps - Standing Shoulder Flexion to 90 Degrees  - 1 x daily - 3 x weekly - 1 sets - 10 reps - 3 sec hold - Standing Shoulder Scaption  - 1 x daily - 3 x weekly - 1 sets - 10 reps - 3 sec hold - Shoulder External Rotation with Anchored Resistance  - 1 x daily - 3 x weekly - 2 sets - 10 reps - Supine Shoulder External Rotation with Dowel  - 1 x daily - 7 x weekly - 2 sets - 10 reps - 3 sec hold

## 2022-05-23 ENCOUNTER — Ambulatory Visit: Payer: Medicare Other

## 2022-05-30 ENCOUNTER — Encounter: Payer: Commercial Managed Care - PPO | Admitting: Physical Therapy

## 2022-06-13 ENCOUNTER — Encounter: Payer: Commercial Managed Care - PPO | Admitting: Physical Therapy

## 2022-06-20 ENCOUNTER — Encounter: Payer: Commercial Managed Care - PPO | Admitting: Physical Therapy

## 2022-06-27 ENCOUNTER — Encounter: Payer: Commercial Managed Care - PPO | Admitting: Physical Therapy

## 2023-07-10 IMAGING — DX DG KNEE COMPLETE 4+V*R*
4 series · 4 of 4 positions shown · non-contrast
Comparison: 12/13/2020

CLINICAL DATA: Fall today, RIGHT knee pain with swelling

EXAM:
RIGHT KNEE - COMPLETE 4+ VIEW

[knee ap]
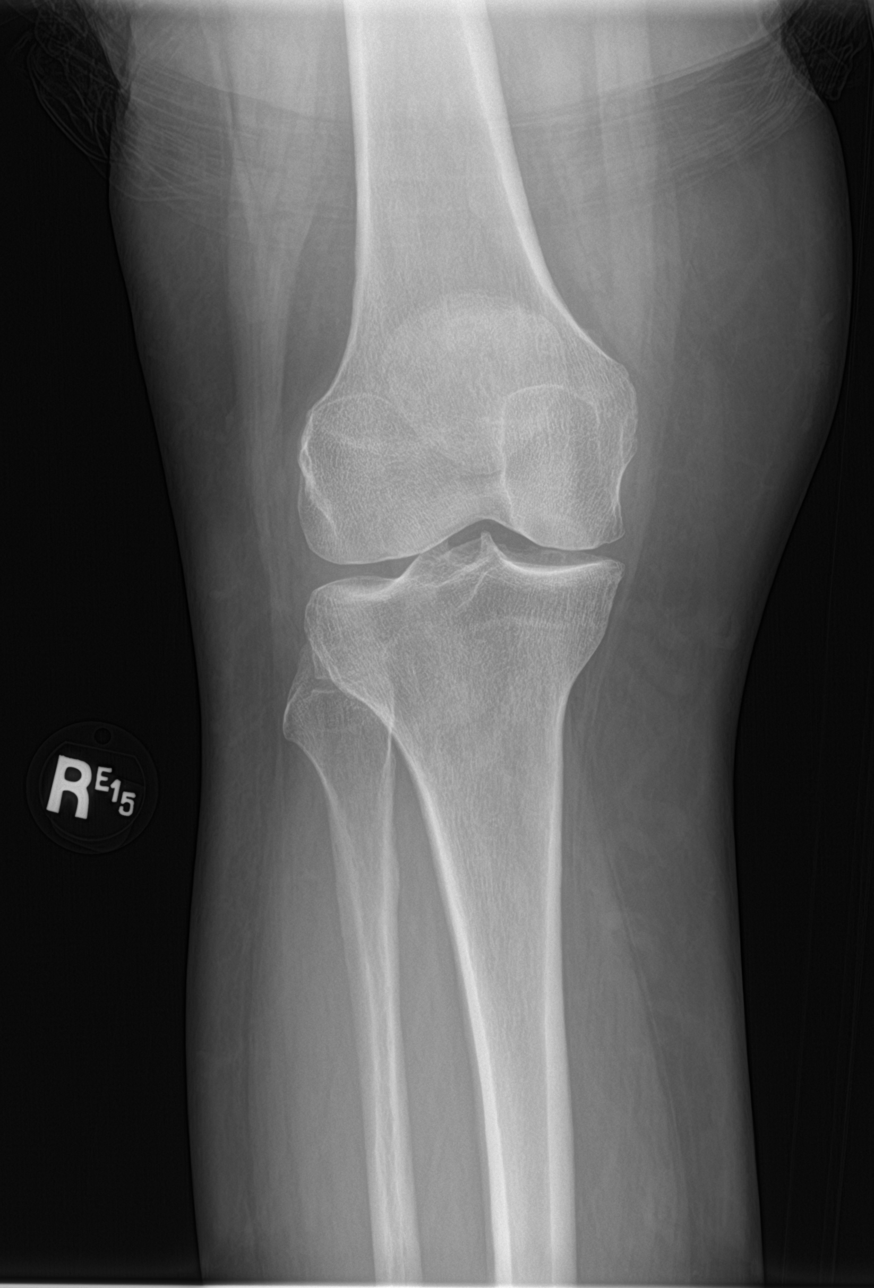

[knee lat]
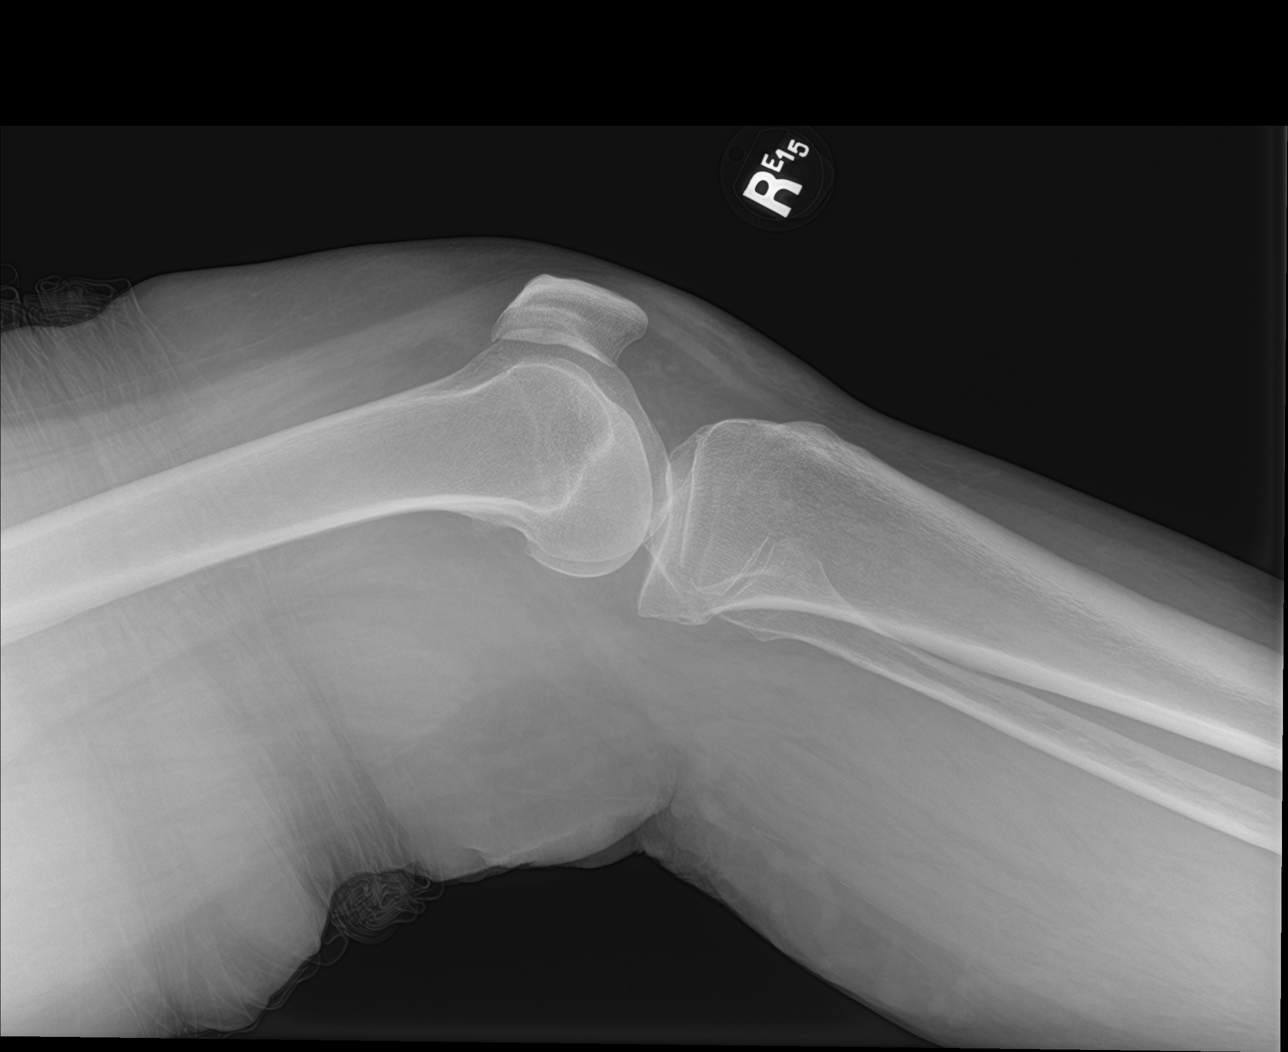

[knee obl (1 of 2)]
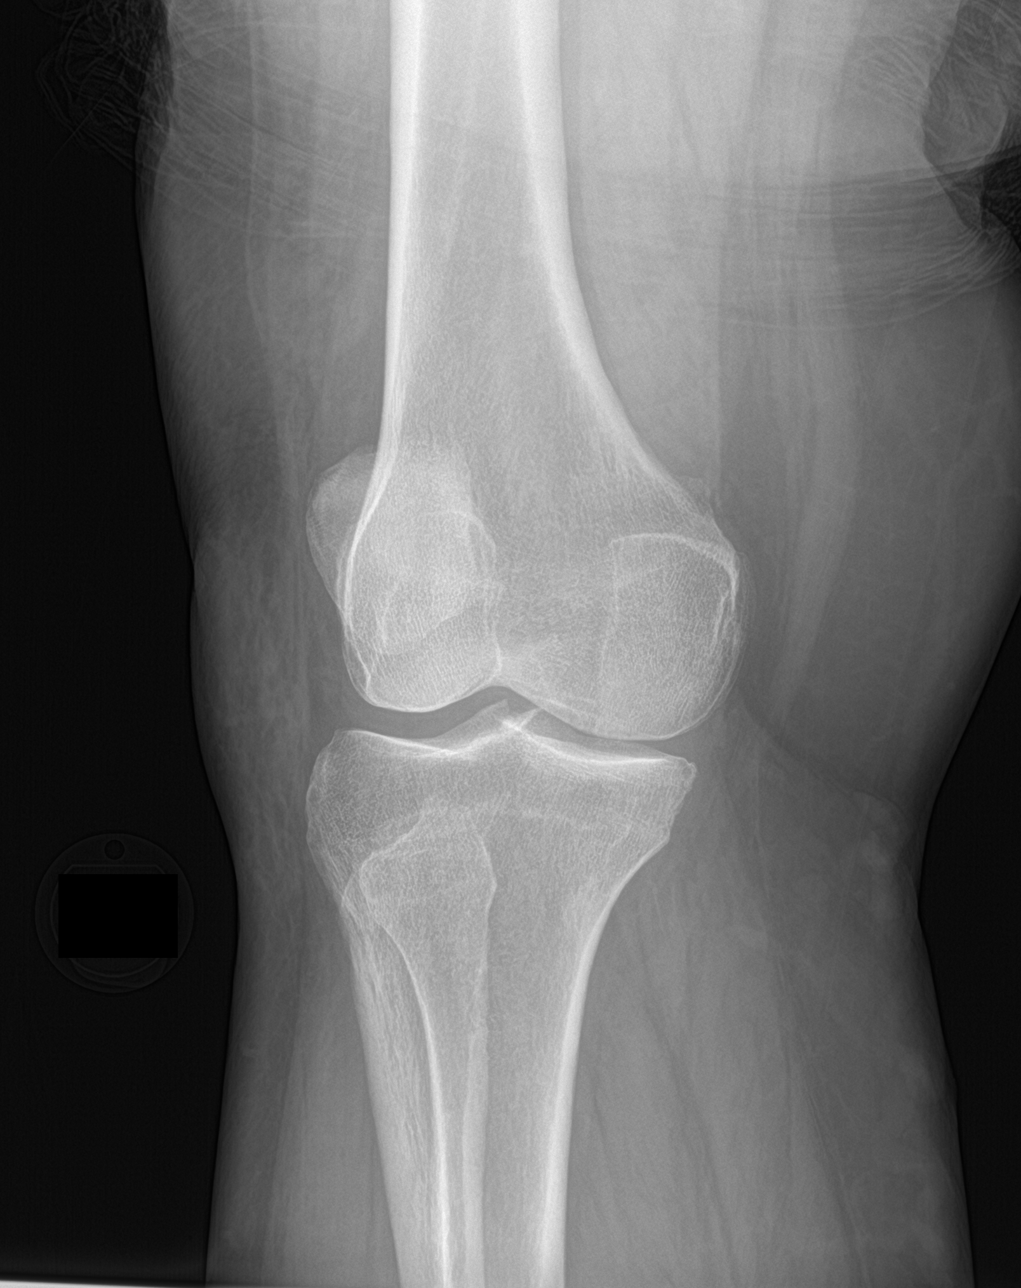

[knee obl (2 of 2)]
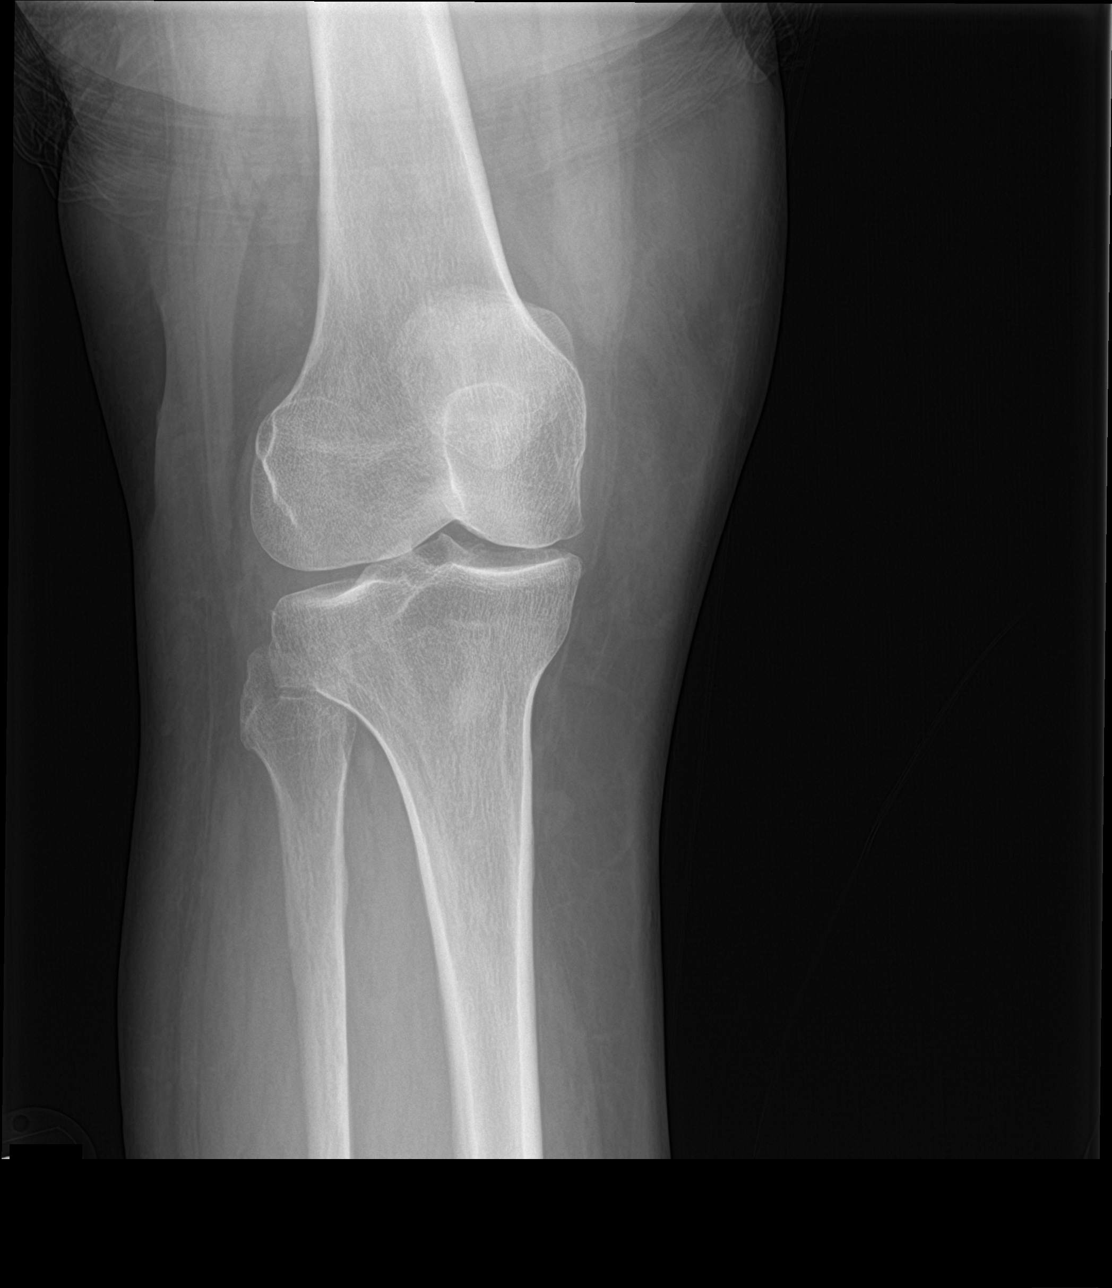

[4 of 4 positions shown; findings below may reference images not displayed]

FINDINGS: Osseous demineralization.

Joint space narrowing at medial compartment and patellofemoral
joint.

No acute fracture, dislocation, or bone destruction.

No joint effusion.
IMPRESSION: Osseous demineralization with degenerative changes RIGHT knee.

No acute osseous abnormalities.

## 2023-07-10 IMAGING — DX DG CHEST 2V
2 series · 2 of 2 positions shown · non-contrast
Comparison: None

CLINICAL DATA: Fall today injuring RIGHT upper extremity

EXAM:
CHEST - 2 VIEW

[chest pa]
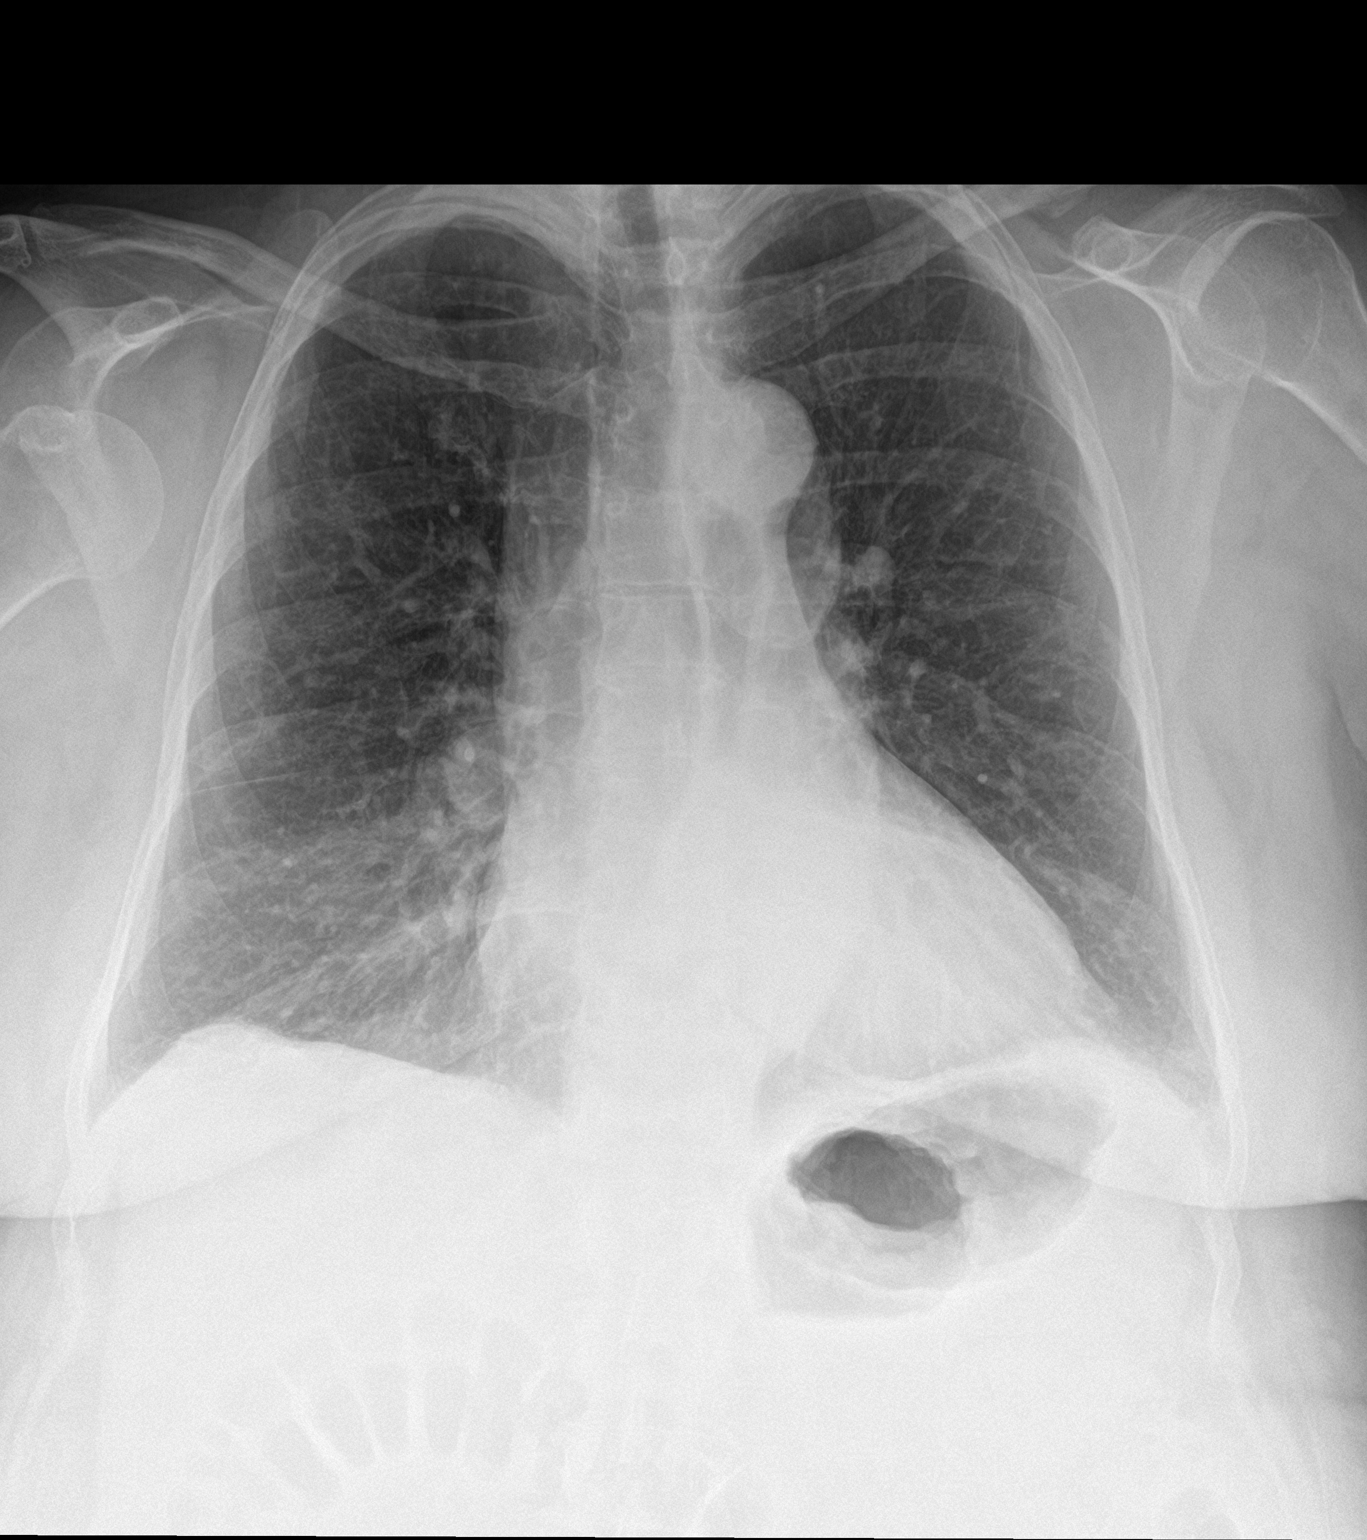

[chest lat]
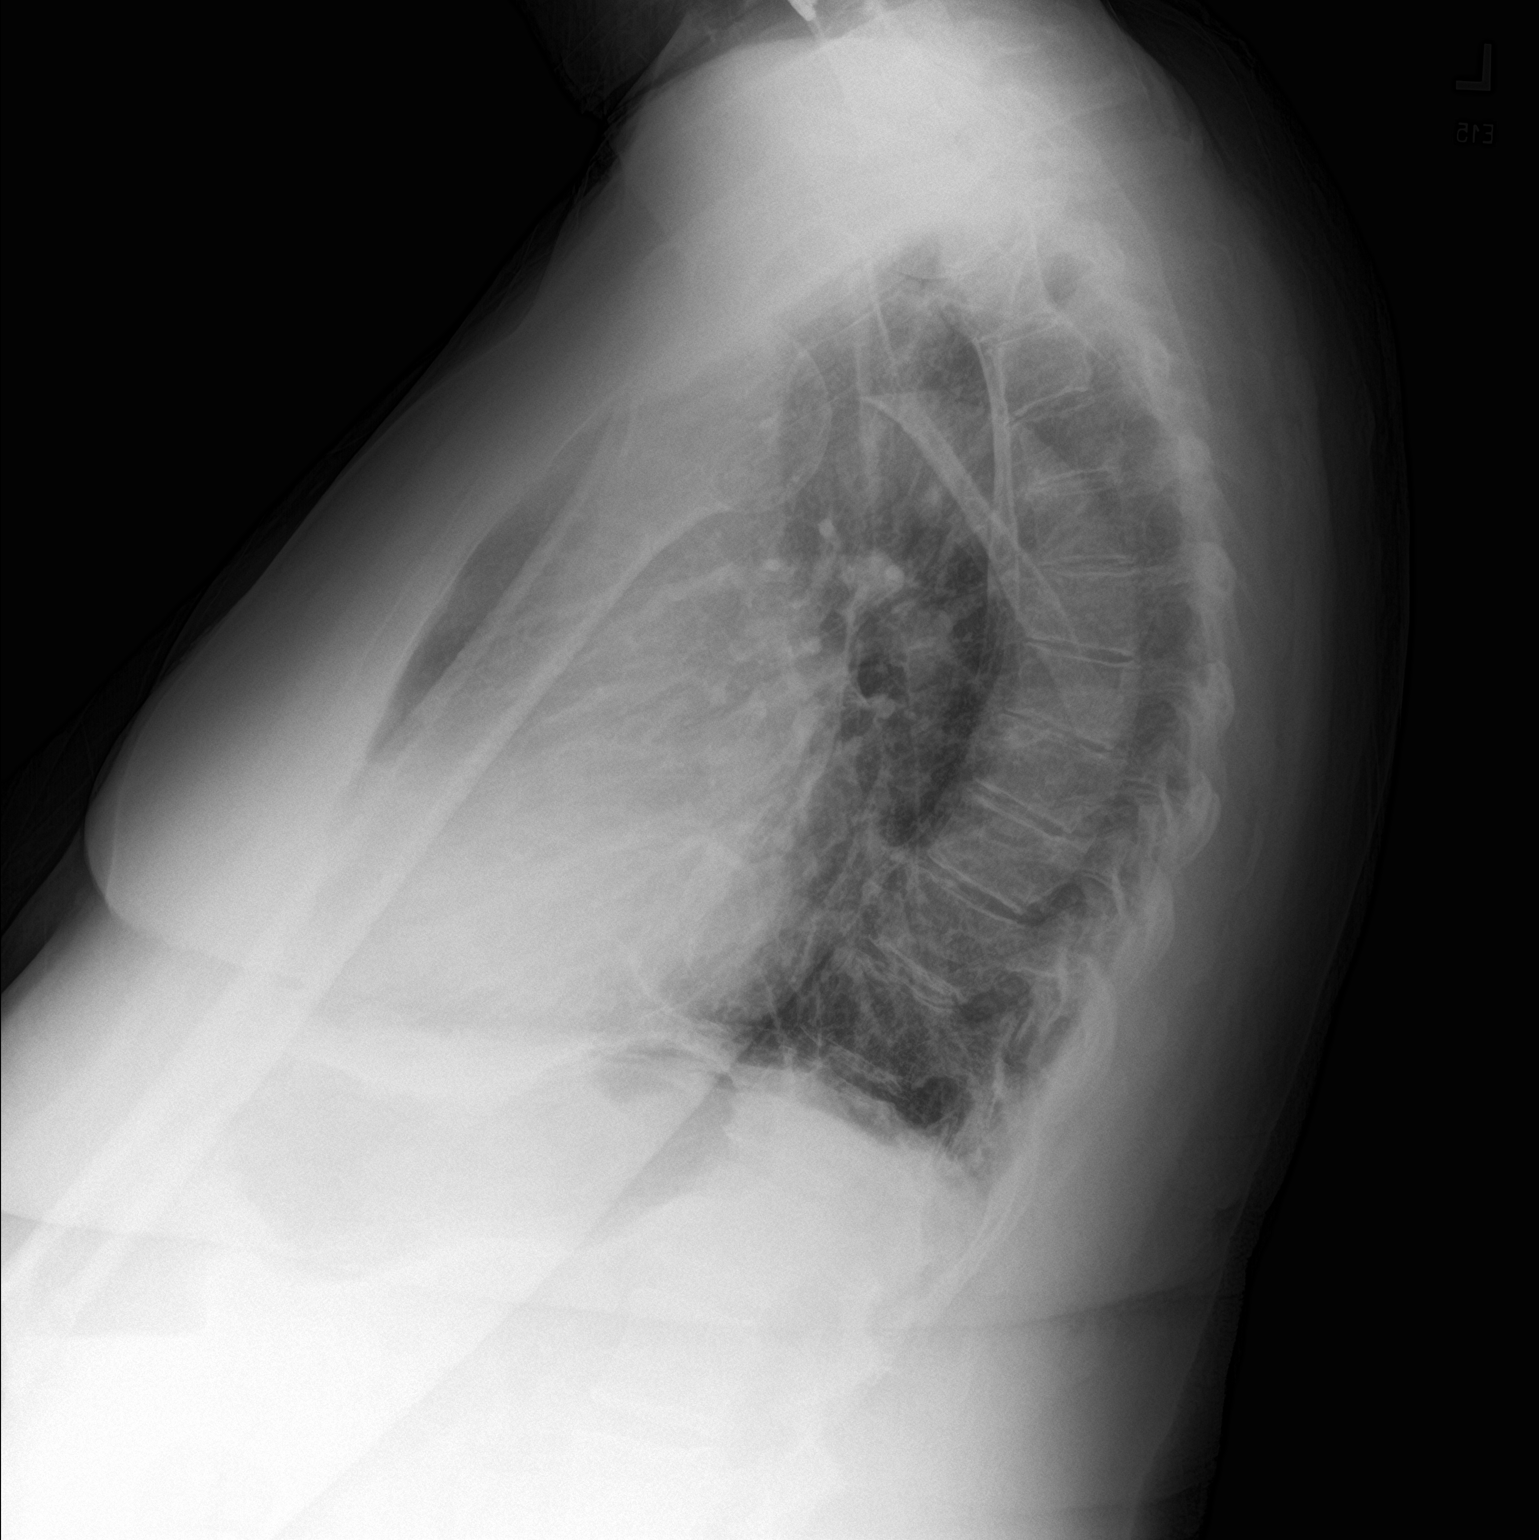

[2 of 2 positions shown; findings below may reference images not displayed]

FINDINGS: Enlargement of cardiac silhouette.

Tortuous aorta.

Mediastinal contours and pulmonary vascularity otherwise normal.

Minimal bibasilar atelectasis.

Lungs otherwise clear.

No infiltrate, pleural effusion, or pneumothorax.

Osseous demineralization with evidence of prior cervical spine
fusion.
IMPRESSION: Enlargement of cardiac silhouette with minimal bibasilar
atelectasis.
